# Patient Record
Sex: Female | Born: 1955 | Race: White | Hispanic: No | Marital: Married | Smoking: Current every day smoker
Health system: Southern US, Community
[De-identification: ages and names within clinical notes are randomized; demographics above are authoritative.]

## PROBLEM LIST (undated history)

## (undated) DIAGNOSIS — N950 Postmenopausal bleeding: Secondary | ICD-10-CM

## (undated) DIAGNOSIS — M1990 Unspecified osteoarthritis, unspecified site: Secondary | ICD-10-CM

## (undated) DIAGNOSIS — Z973 Presence of spectacles and contact lenses: Secondary | ICD-10-CM

## (undated) DIAGNOSIS — F411 Generalized anxiety disorder: Secondary | ICD-10-CM

## (undated) DIAGNOSIS — R7303 Prediabetes: Secondary | ICD-10-CM

## (undated) DIAGNOSIS — Z9889 Other specified postprocedural states: Secondary | ICD-10-CM

## (undated) DIAGNOSIS — G2581 Restless legs syndrome: Secondary | ICD-10-CM

## (undated) DIAGNOSIS — E785 Hyperlipidemia, unspecified: Secondary | ICD-10-CM

## (undated) DIAGNOSIS — Z85828 Personal history of other malignant neoplasm of skin: Secondary | ICD-10-CM

## (undated) DIAGNOSIS — I10 Essential (primary) hypertension: Secondary | ICD-10-CM

## (undated) DIAGNOSIS — J189 Pneumonia, unspecified organism: Secondary | ICD-10-CM

## (undated) HISTORY — PX: KNEE ARTHROSCOPY: SHX127

## (undated) HISTORY — DX: Prediabetes: R73.03

## (undated) HISTORY — DX: Essential (primary) hypertension: I10

## (undated) HISTORY — DX: Hyperlipidemia, unspecified: E78.5

## (undated) HISTORY — DX: Pneumonia, unspecified organism: J18.9

---
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
## 2006-03-01 ENCOUNTER — Encounter: Payer: Self-pay | Admitting: Family Medicine

## 2006-03-01 ENCOUNTER — Ambulatory Visit: Payer: Self-pay | Admitting: Family Medicine

## 2006-03-01 ENCOUNTER — Other Ambulatory Visit: Admission: RE | Admit: 2006-03-01 | Discharge: 2006-03-01 | Payer: Self-pay | Admitting: Family Medicine

## 2006-03-01 LAB — CONVERTED CEMR LAB: Pap Smear: NORMAL

## 2006-06-09 LAB — HM COLONOSCOPY

## 2006-07-17 DIAGNOSIS — F411 Generalized anxiety disorder: Secondary | ICD-10-CM

## 2006-07-17 DIAGNOSIS — F17200 Nicotine dependence, unspecified, uncomplicated: Secondary | ICD-10-CM

## 2006-11-06 ENCOUNTER — Encounter: Payer: Self-pay | Admitting: Family Medicine

## 2007-05-07 ENCOUNTER — Ambulatory Visit: Payer: Self-pay | Admitting: Family Medicine

## 2007-05-07 ENCOUNTER — Other Ambulatory Visit: Admission: RE | Admit: 2007-05-07 | Discharge: 2007-05-07 | Payer: Self-pay | Admitting: Family Medicine

## 2007-05-07 ENCOUNTER — Encounter: Payer: Self-pay | Admitting: Family Medicine

## 2007-05-07 DIAGNOSIS — R635 Abnormal weight gain: Secondary | ICD-10-CM

## 2007-05-07 DIAGNOSIS — M79609 Pain in unspecified limb: Secondary | ICD-10-CM

## 2007-05-07 LAB — CONVERTED CEMR LAB: Pap Smear: NORMAL

## 2007-05-08 ENCOUNTER — Encounter: Payer: Self-pay | Admitting: Family Medicine

## 2007-05-08 LAB — CONVERTED CEMR LAB
ALT: 28 units/L (ref 0–35)
AST: 28 units/L (ref 0–37)
Albumin: 4.3 g/dL (ref 3.5–5.2)
Alkaline Phosphatase: 61 units/L (ref 39–117)
BUN: 14 mg/dL (ref 6–23)
CO2: 28 meq/L (ref 19–32)
Calcium: 9.2 mg/dL (ref 8.4–10.5)
Chloride: 103 meq/L (ref 96–112)
Cholesterol, target level: 200 mg/dL
Cholesterol: 202 mg/dL — ABNORMAL HIGH (ref 0–200)
Creatinine, Ser: 0.77 mg/dL (ref 0.40–1.20)
Glucose, Bld: 94 mg/dL (ref 70–99)
HDL goal, serum: 40 mg/dL
HDL: 67 mg/dL (ref >39)
LDL Cholesterol: 114 mg/dL — ABNORMAL HIGH (ref 0–99)
LDL Goal: 160 mg/dL
Potassium: 4.3 meq/L (ref 3.5–5.3)
Sodium: 141 meq/L (ref 135–145)
TSH: 0.832 microintl units/mL (ref 0.350–5.50)
Total Bilirubin: 0.5 mg/dL (ref 0.3–1.2)
Total CHOL/HDL Ratio: 3
Total Protein: 7.2 g/dL (ref 6.0–8.3)
Triglycerides: 103 mg/dL (ref <150)
VLDL: 21 mg/dL (ref 0–40)

## 2007-05-14 ENCOUNTER — Ambulatory Visit: Payer: Self-pay | Admitting: Family Medicine

## 2007-05-27 ENCOUNTER — Encounter: Payer: Self-pay | Admitting: Family Medicine

## 2007-08-22 ENCOUNTER — Ambulatory Visit: Payer: Self-pay | Admitting: Family Medicine

## 2007-08-22 DIAGNOSIS — I10 Essential (primary) hypertension: Secondary | ICD-10-CM | POA: Insufficient documentation

## 2007-09-12 ENCOUNTER — Ambulatory Visit: Payer: Self-pay | Admitting: Family Medicine

## 2007-09-24 ENCOUNTER — Ambulatory Visit: Payer: Self-pay | Admitting: Family Medicine

## 2008-01-15 ENCOUNTER — Ambulatory Visit: Payer: Self-pay | Admitting: Family Medicine

## 2008-01-15 DIAGNOSIS — R0789 Other chest pain: Secondary | ICD-10-CM | POA: Insufficient documentation

## 2008-01-16 ENCOUNTER — Encounter: Payer: Self-pay | Admitting: Family Medicine

## 2008-01-16 LAB — CONVERTED CEMR LAB
ALT: 21 units/L (ref 0–35)
AST: 20 units/L (ref 0–37)
Albumin: 4.7 g/dL (ref 3.5–5.2)
Alkaline Phosphatase: 69 units/L (ref 39–117)
BUN: 12 mg/dL (ref 6–23)
CO2: 26 meq/L (ref 19–32)
Calcium: 9.1 mg/dL (ref 8.4–10.5)
Chloride: 102 meq/L (ref 96–112)
Creatinine, Ser: 0.77 mg/dL (ref 0.40–1.20)
Glucose, Bld: 86 mg/dL (ref 70–99)
HCT: 45.9 % (ref 36.0–46.0)
Hemoglobin: 15.3 g/dL — ABNORMAL HIGH (ref 12.0–15.0)
MCHC: 33.3 g/dL (ref 30.0–36.0)
MCV: 99.1 fL (ref 78.0–100.0)
Platelets: 268 10*3/uL (ref 150–400)
Potassium: 4.2 meq/L (ref 3.5–5.3)
RBC: 4.63 M/uL (ref 3.87–5.11)
RDW: 13 % (ref 11.5–15.5)
Sodium: 139 meq/L (ref 135–145)
TSH: 0.673 microintl units/mL (ref 0.350–5.50)
Total Bilirubin: 0.3 mg/dL (ref 0.3–1.2)
Total CK: 49 units/L (ref 7–177)
Total Protein: 7.9 g/dL (ref 6.0–8.3)
WBC: 6.7 10*3/uL (ref 4.0–10.5)

## 2008-02-05 ENCOUNTER — Encounter: Payer: Self-pay | Admitting: Family Medicine

## 2008-07-07 ENCOUNTER — Encounter: Payer: Self-pay | Admitting: Family Medicine

## 2008-07-14 ENCOUNTER — Ambulatory Visit: Payer: Self-pay | Admitting: Family Medicine

## 2008-07-14 ENCOUNTER — Encounter: Payer: Self-pay | Admitting: Family Medicine

## 2008-07-14 ENCOUNTER — Other Ambulatory Visit: Admission: RE | Admit: 2008-07-14 | Discharge: 2008-07-14 | Payer: Self-pay | Admitting: Family Medicine

## 2008-07-14 ENCOUNTER — Encounter: Admission: RE | Admit: 2008-07-14 | Discharge: 2008-07-14 | Payer: Self-pay | Admitting: Family Medicine

## 2008-07-14 DIAGNOSIS — R21 Rash and other nonspecific skin eruption: Secondary | ICD-10-CM

## 2008-07-14 DIAGNOSIS — R5383 Other fatigue: Secondary | ICD-10-CM

## 2008-07-14 DIAGNOSIS — Z780 Asymptomatic menopausal state: Secondary | ICD-10-CM | POA: Insufficient documentation

## 2008-07-14 DIAGNOSIS — R5381 Other malaise: Secondary | ICD-10-CM | POA: Insufficient documentation

## 2008-07-15 ENCOUNTER — Encounter: Payer: Self-pay | Admitting: Family Medicine

## 2008-07-16 DIAGNOSIS — E8881 Metabolic syndrome: Secondary | ICD-10-CM

## 2008-07-16 LAB — CONVERTED CEMR LAB
ALT: 22 units/L (ref 0–35)
AST: 24 units/L (ref 0–37)
Albumin: 4.4 g/dL (ref 3.5–5.2)
Alkaline Phosphatase: 62 units/L (ref 39–117)
BUN: 15 mg/dL (ref 6–23)
CO2: 24 meq/L (ref 19–32)
Calcium: 9.2 mg/dL (ref 8.4–10.5)
Chloride: 103 meq/L (ref 96–112)
Cholesterol: 197 mg/dL (ref 0–200)
Creatinine, Ser: 0.72 mg/dL (ref 0.40–1.20)
Folate: >20 ng/mL
Glucose, Bld: 106 mg/dL — ABNORMAL HIGH (ref 70–99)
HCT: 41 % (ref 36.0–46.0)
HDL: 51 mg/dL (ref >39)
Hemoglobin: 13.4 g/dL (ref 12.0–15.0)
LDL Cholesterol: 120 mg/dL — ABNORMAL HIGH (ref 0–99)
MCHC: 32.7 g/dL (ref 30.0–36.0)
MCV: 90.5 fL (ref 78.0–100.0)
Platelets: 367 10*3/uL (ref 150–400)
Potassium: 4.4 meq/L (ref 3.5–5.3)
RBC: 4.53 M/uL (ref 3.87–5.11)
RDW: 13.5 % (ref 11.5–15.5)
Sed Rate: 5 mm/hr (ref 0–22)
Sodium: 139 meq/L (ref 135–145)
TSH: 0.637 microintl units/mL (ref 0.350–4.50)
Testosterone: 42.83 ng/dL (ref 10–70)
Total Bilirubin: 0.3 mg/dL (ref 0.3–1.2)
Total CHOL/HDL Ratio: 3.9
Total Protein: 7.2 g/dL (ref 6.0–8.3)
Triglycerides: 130 mg/dL (ref <150)
VLDL: 26 mg/dL (ref 0–40)
Vit D, 1,25-Dihydroxy: 35 (ref 30–89)
Vitamin B-12: 934 pg/mL — ABNORMAL HIGH (ref 211–911)
WBC: 6.5 10*3/uL (ref 4.0–10.5)

## 2008-07-20 ENCOUNTER — Encounter: Payer: Self-pay | Admitting: Family Medicine

## 2008-07-21 ENCOUNTER — Encounter: Payer: Self-pay | Admitting: Family Medicine

## 2008-07-27 ENCOUNTER — Telehealth: Payer: Self-pay | Admitting: Family Medicine

## 2008-07-30 ENCOUNTER — Encounter: Payer: Self-pay | Admitting: Family Medicine

## 2008-08-04 ENCOUNTER — Telehealth: Payer: Self-pay | Admitting: Family Medicine

## 2008-08-14 ENCOUNTER — Ambulatory Visit: Payer: Self-pay | Admitting: Family Medicine

## 2008-08-14 DIAGNOSIS — M542 Cervicalgia: Secondary | ICD-10-CM | POA: Insufficient documentation

## 2008-08-14 DIAGNOSIS — G2581 Restless legs syndrome: Secondary | ICD-10-CM

## 2008-10-14 ENCOUNTER — Encounter: Payer: Self-pay | Admitting: Family Medicine

## 2008-11-20 ENCOUNTER — Ambulatory Visit: Payer: Self-pay | Admitting: Family Medicine

## 2008-11-27 ENCOUNTER — Ambulatory Visit: Payer: Self-pay | Admitting: Family Medicine

## 2008-11-27 LAB — CONVERTED CEMR LAB: Blood Glucose, Fasting: 116 mg/dL

## 2008-12-18 ENCOUNTER — Telehealth (INDEPENDENT_AMBULATORY_CARE_PROVIDER_SITE_OTHER): Payer: Self-pay | Admitting: *Deleted

## 2009-01-25 ENCOUNTER — Telehealth: Payer: Self-pay | Admitting: Family Medicine

## 2009-06-04 ENCOUNTER — Ambulatory Visit: Payer: Self-pay | Admitting: Family Medicine

## 2009-06-04 DIAGNOSIS — G4700 Insomnia, unspecified: Secondary | ICD-10-CM

## 2009-06-04 LAB — CONVERTED CEMR LAB: Blood Glucose, Fasting: 99 mg/dL

## 2009-06-22 ENCOUNTER — Ambulatory Visit: Payer: Self-pay | Admitting: Family Medicine

## 2009-10-13 ENCOUNTER — Encounter: Payer: Self-pay | Admitting: Family Medicine

## 2009-10-18 ENCOUNTER — Ambulatory Visit: Payer: Self-pay | Admitting: Family Medicine

## 2009-10-19 LAB — CONVERTED CEMR LAB
ALT: 18 units/L (ref 0–35)
AST: 22 units/L (ref 0–37)
Albumin: 4.6 g/dL (ref 3.5–5.2)
Alkaline Phosphatase: 59 units/L (ref 39–117)
BUN: 10 mg/dL (ref 6–23)
CO2: 26 meq/L (ref 19–32)
Calcium: 9.2 mg/dL (ref 8.4–10.5)
Chloride: 101 meq/L (ref 96–112)
Cholesterol: 248 mg/dL — ABNORMAL HIGH (ref 0–200)
Creatinine, Ser: 0.83 mg/dL (ref 0.40–1.20)
Glucose, Bld: 102 mg/dL — ABNORMAL HIGH (ref 70–99)
HDL: 73 mg/dL (ref >39)
LDL Cholesterol: 151 mg/dL — ABNORMAL HIGH (ref 0–99)
Potassium: 4.3 meq/L (ref 3.5–5.3)
Sodium: 140 meq/L (ref 135–145)
Total Bilirubin: 0.4 mg/dL (ref 0.3–1.2)
Total CHOL/HDL Ratio: 3.4
Total Protein: 7.4 g/dL (ref 6.0–8.3)
Triglycerides: 119 mg/dL (ref <150)
VLDL: 24 mg/dL (ref 0–40)

## 2009-11-12 ENCOUNTER — Ambulatory Visit: Payer: Self-pay | Admitting: Family Medicine

## 2009-11-18 ENCOUNTER — Telehealth: Payer: Self-pay | Admitting: Family Medicine

## 2009-11-22 ENCOUNTER — Encounter: Payer: Self-pay | Admitting: Family Medicine

## 2009-12-10 ENCOUNTER — Ambulatory Visit: Payer: Self-pay | Admitting: Family Medicine

## 2010-03-30 ENCOUNTER — Ambulatory Visit: Payer: Self-pay | Admitting: Family Medicine

## 2010-03-30 DIAGNOSIS — E785 Hyperlipidemia, unspecified: Secondary | ICD-10-CM

## 2010-03-31 LAB — CONVERTED CEMR LAB: AST: 19 units/L (ref 0–37)

## 2010-10-20 ENCOUNTER — Other Ambulatory Visit: Payer: Self-pay | Admitting: Family Medicine

## 2010-10-20 ENCOUNTER — Other Ambulatory Visit
Admission: RE | Admit: 2010-10-20 | Discharge: 2010-10-20 | Payer: Self-pay | Source: Home / Self Care | Admitting: Family Medicine

## 2010-10-20 ENCOUNTER — Ambulatory Visit
Admission: RE | Admit: 2010-10-20 | Discharge: 2010-10-20 | Payer: Self-pay | Source: Home / Self Care | Attending: Family Medicine | Admitting: Family Medicine

## 2010-10-24 ENCOUNTER — Encounter: Payer: Self-pay | Admitting: Family Medicine

## 2010-10-25 LAB — CONVERTED CEMR LAB
Albumin: 4.6 g/dL (ref 3.5–5.2)
BUN: 14 mg/dL (ref 6–23)
CO2: 30 meq/L (ref 19–32)
Calcium: 9.2 mg/dL (ref 8.4–10.5)
Chloride: 100 meq/L (ref 96–112)
Glucose, Bld: 100 mg/dL — ABNORMAL HIGH (ref 70–99)
HDL: 63 mg/dL (ref >39)
Hemoglobin: 14.4 g/dL (ref 12.0–15.0)
LDL Cholesterol: 99 mg/dL (ref 0–99)
MCHC: 32.4 g/dL (ref 30.0–36.0)
Potassium: 4.4 meq/L (ref 3.5–5.3)
RBC: 4.57 M/uL (ref 3.87–5.11)
Sodium: 139 meq/L (ref 135–145)
Total Protein: 7.2 g/dL (ref 6.0–8.3)
Triglycerides: 107 mg/dL (ref <150)
WBC: 5.8 10*3/uL (ref 4.0–10.5)

## 2010-10-27 LAB — CYTOLOGY - PAP: Pap Smear: NORMAL

## 2010-11-08 NOTE — Miscellaneous (Signed)
  Clinical Lists Changes  Medications: Added new medication of REQUIP 1 MG TABS (ROPINIROLE HCL) Take one tablet by mouth daily - Signed Rx of REQUIP 1 MG TABS (ROPINIROLE HCL) Take one tablet by mouth daily;  #30 x 0;  Signed;  Entered by: Kathlene November;  Authorized by: Nani Gasser MD;  Method used: [facility] to [facility] [phone]*, [address] [place], [place]  [address], Ph: [phone] or [phone], Fax: [phone]    Prescriptions: REQUIP 1 MG TABS (ROPINIROLE HCL) Take one tablet by mouth daily  #30 x 0   Entered by:   Kathlene November   Authorized by:   Nani Gasser MD   Signed by:   Kathlene November on 11/22/2009   Method used:   Electronically to        [facility] [phone]* (retail)       [address] [place], [place]  [address]       Ph: [phone] or [phone]       Fax: [phone]   RxID:   [id number]
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
## 2010-11-08 NOTE — Assessment & Plan Note (Signed)
Summary: CPE   Vital Signs:  Patient profile:   54 year old female Height:      64.5 inches Weight:      143 pounds Pulse rate:   70 / minute BP sitting:   132 / 79  (left arm) Cuff size:   regular  Vitals Entered By: Kathlene November (October 18, 2009 8:23 AM) CC: CPE no pap   Primary Care Provider:  Linford Arnold, C.   CC:  CPE no pap.  History of Present Illness: here for CPE. No specific complaints but still having insomnia. Says the Ambien CR didnt really help. Since quitting her old job she does feel less stressed so says still waking up frequently but doesn't take as long to fall back asleep. Notices her joint pain flares worse when her mood is down.  She saw rheumatology and they felt she didn't really meet enought criteia for fibromyalgia. They did feel some of it is stress induced.   Current Medications (verified): 1)  Alprazolam 0.5 Mg Tabs (Alprazolam) .... Take 1 Tablet By Mouth Two Times A Day As Needed 2)  Lexapro 20 Mg Tabs (Escitalopram Oxalate) .... Take 1 Tablet By Mouth Once A Day  Allergies (verified): 1)  ! Celexa (Citalopram Hydrobromide)  Comments:  Nurse/Medical Assistant: The patient's medications and allergies were reviewed with the patient and were updated in the Medication and Allergy Lists. Kathlene November (October 18, 2009 8:24 AM)  Past History:  Past Surgical History: Last updated: 11/06/2006 arthroscopy  L knee  1980, 1981  T & A  at age 38  Family History: Last updated: 07/14/2008 Mother-MI at age 24, stroke Mother at age 15 -BrCa, COPD (smoker).   Social History: Working at [employer].  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  Married to [place] with 2 children.  Smokes 1 ppd for 30+yrs, 1 EtOH/day, no drugs, 1 caff/day, walks regularly.    Impression & Recommendations:  Problem # 1:  HEALTH SCREENING (ICD-V70.0)  See pt H.O. Due fore screning labs.  Vaccines are up to date.  pap is up to date. Due next week.   Orders: T-Comprehensive Metabolic Panel [id number]) T-Lipid Profile ([id number])  Problem # 2:  ANXIETY (ICD-300.00) Discussed chaning to cymbalta as she really feels her joint pain is related to her mood and rheumatology agrees.  Fu in 3 weeks. samples given. Call if any problems. See pt H.O.  on instructions.   Her updated medication list for this problem includes:    Alprazolam 0.5 Mg Tabs (Alprazolam) .Marland Kitchen... Take 1 tablet by mouth two times a day as needed    Lexapro 20 Mg Tabs (Escitalopram oxalate) .Marland Kitchen... Take 1 tablet by mouth once a day  Complete Medication List: 1)  Alprazolam 0.5 Mg Tabs (Alprazolam) .... Take 1 tablet by mouth two times a day as needed 2)  Lexapro 20 Mg Tabs (Escitalopram oxalate) .... Take 1 tablet by mouth once a day  Patient Instructions: 1)  Decrease Lexapro to 10mg  for one week then stop and start the cymbalta 30mg  daily. May feel a little more anxious during the switch 2)  Then followup in 3 weeks to see how doing on the cymbalta.   3)  Encourage more regular exercise. 4)  Continue your calcium and vitamin D.   Flu Vaccine Next Due:  Refused Flex Sig Next Due:  Not Indicated Hemoccult Next Due:  Not Indicated

## 2010-11-08 NOTE — Assessment & Plan Note (Signed)
Summary: Anxiety , Insomnia   Vital Signs:  Patient profile:   55 year old female Weight:      144.50 pounds Pulse rate:   83 / minute BP sitting:   122 / 77  Vitals Entered By: Kandice Hams (November 12, 2009 3:02 PM) CC: FOLLOWUP ON MED   Primary Care Provider:  Linford Arnold, C.   CC:  FOLLOWUP ON MED.  History of Present Illness: Feels the cymbalta is helpign adn helping her pain in her lower back. Would like to increase to 60mg .  Hot flashes are a little more but not as bad as when first stopped the lexapro. No other S.E.  Has been happy with the med thus far.  Less anxious when wakes up.   Requip didn't help for sleep. Ambien didn't work Recruitment consultant. Lunesta didn't work. Xanax does seem to help.  Years of chronic insomnia.     Allergies: 1)  ! Celexa (Citalopram Hydrobromide)  Physical Exam  General:  Well-developed,well-nourished,in no acute distress; alert,appropriate and cooperative throughout examination Lungs:  Normal respiratory effort, chest expands symmetrically. Lungs are clear to auscultation, no crackles or wheezes. Heart:  Normal rate and regular rhythm. S1 and S2 normal without gallop, murmur, click, rub or other extra sounds. Skin:  no rashes.   Psych:  Cognition and judgment appear intact. Alert and cooperative with normal attention span and concentration. No apparent delusions, illusions, hallucinations   Impression & Recommendations:  Problem # 1:  ANXIETY (ICD-300.00) Increase the cymbalta for 60mg .  I hope this will improve her sxs. FU in about 6 months.  Call if any S.E.  F/u in 6 weeks.  Her updated medication list for this problem includes:    Alprazolam 0.5 Mg Tabs (Alprazolam) .Marland Kitchen... Take 1 tablet by mouth two times a day as needed    Cymbalta 60 Mg Cpep (Duloxetine hcl) .Marland Kitchen... Take 1 tablet by mouth once a day  Problem # 2:  INSOMNIA (ICD-780.52) Consider seroquel for sleep. Discussed that this is not FDA approved used but with her hx of resistance to  typical sleep aids it may be more mood related. Will increase the cymbalta for now and see if gets some improvement in her sleep with this.    Complete Medication List: 1)  Alprazolam 0.5 Mg Tabs (Alprazolam) .... Take 1 tablet by mouth two times a day as needed 2)  Cymbalta 60 Mg Cpep (Duloxetine hcl) .... Take 1 tablet by mouth once a day 3)  Simvastatin 20 Mg Tabs (Simvastatin) .... Take 1 tablet by mouth once a day at bedtime Prescriptions: CYMBALTA 60 MG CPEP (DULOXETINE HCL) Take 1 tablet by mouth once a day  #30 x 1   Entered and Authorized by:   Nani Gasser MD   Signed by:   Nani Gasser MD on 11/12/2009   Method used:   [facility] to        [facility] [phone][facility] (retail)       [address] [place], [place]  [address]       Ph: [phone] or [phone]       Fax: [phone]   RxID:   [id number]
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
## 2010-11-08 NOTE — Progress Notes (Signed)
Summary: new rx  Phone Note Call from Patient Call back at Home Phone [phone]   Caller: Patient Call For: Gerica Koble Summary of Call: came in a week ago and was put on cymbalta.  She wasn't sleeping and she needs something.  She said you put in a standing order? Initial call taken by: Alfred Levins, CMA,  November 18, 2009 3:11 PM  Follow-up for Phone Call        OK will call in seroquel as discussed at OV>  Follow-up by: Nani Gasser MD,  November 18, 2009 4:04 PM  Additional Follow-up for Phone Call Additional follow up Details #1::        pt aware Additional Follow-up by: Alfred Levins, CMA,  November 18, 2009 4:24 PM    New/Updated Medications: SEROQUEL 25 MG TABS (QUETIAPINE FUMARATE) Take 1 tablet by mouth once a day at bedtime Prescriptions: SEROQUEL 25 MG TABS (QUETIAPINE FUMARATE) Take 1 tablet by mouth once a day at bedtime  #30 x 0   Entered and Authorized by:   Nani Gasser MD   Signed by:   Nani Gasser MD on 11/18/2009   Method used:   [facility]ically to        [facility] [phone]* (retail)       [address] [place], [place]  [address]       Ph: [phone] or [phone]       Fax: [phone]   RxID:   [id number]

## 2010-11-10 NOTE — Assessment & Plan Note (Signed)
Summary: Pap only, lab slip   Vital Signs:  Patient profile:   55 year old female Height:      64.5 inches Weight:      147 pounds Pulse rate:   90 / minute BP sitting:   142 / 74  (right arm) Cuff size:   regular  Vitals Entered By: Avon Gully CMA, Duncan Dull) (October 20, 2010 10:34 AM) CC: pap only   Primary Care Provider:  Nani Gasser MD  CC:  pap only.  History of Present Illness: No concerns.Still some pain with vaginal intercourse.Sthinks she is due for labs as well.   Current Medications (verified): 1)  Alprazolam 0.5 Mg Tabs (Alprazolam) .... Take 1 Tablet By Mouth Two Times A Day As Needed 2)  Simvastatin 20 Mg Tabs (Simvastatin) .... Take 1 Tablet By Mouth Once A Day At Bedtime 3)  Lexapro 20 Mg Tabs (Escitalopram Oxalate) .Marland Kitchen.. 1 Tab By Mouth Daily  Allergies (verified): 1)  ! Celexa (Citalopram Hydrobromide)  Comments:  Nurse/Medical Assistant: The patient's medications and allergies were reviewed with the patient and were updated in the Medication and Allergy Lists. Avon Gully CMA, Duncan Dull) (October 20, 2010 10:35 AM)  Physical Exam  General:  Well-developed,well-nourished,in no acute distress; alert,appropriate and cooperative throughout examination Genitalia:  Normal introitus for age, no external lesions, no vaginal discharge, mucosa pink and moist, no vaginal or cervical lesions, no vaginal atrophy, no friaility or hemorrhage, normal uterus size and position, no adnexal masses or tenderness   Impression & Recommendations:  Problem # 1:  SCREENING FOR MALIGNANT NEOPLASM OF THE CERVIX (ICD-V76.2) Exam is normal. We will call with the results.   Problem # 2:  HYPERLIPIDEMIA (ICD-272.4) Due to recheck labs. She says she is doing well on the simvastatin and taking it much more consistantly.  Her updated medication list for this problem includes:    Simvastatin 20 Mg Tabs (Simvastatin) .Marland Kitchen... Take 1 tablet by mouth once a day at  bedtime  Complete Medication List: 1)  Alprazolam 0.5 Mg Tabs (Alprazolam) .... Take 1 tablet by mouth two times a day as needed 2)  Simvastatin 20 Mg Tabs (Simvastatin) .... Take 1 tablet by mouth once a day at bedtime 3)  Lexapro 20 Mg Tabs (Escitalopram oxalate) .Marland Kitchen.. 1 tab by mouth daily  Other Orders: T-Comprehensive Metabolic Panel [id number]) T-Lipid Profile [id number]) T-CBC No Diff ([id number])   Orders Added: 1)  T-Comprehensive Metabolic Panel [[id number]] 2)  T-Lipid Profile [[id number]] 3)  T-CBC No Diff [[id number]] 4)  Est. Patient Level III [96295]  Appended Document: Pap only, lab slip Call pt: Normal Mammogram. Return for follow up Mammogram in 1 year January 13, 20121:15 PM [place] MD, [facility]   4:46 PM October 21, 2010 McCrimmon CMA, Duncan Dull), Sue Lush pt notified

## 2011-02-07 ENCOUNTER — Encounter: Payer: Self-pay | Admitting: Family Medicine

## 2011-02-08 ENCOUNTER — Encounter: Payer: Self-pay | Admitting: Family Medicine

## 2011-02-08 ENCOUNTER — Ambulatory Visit (INDEPENDENT_AMBULATORY_CARE_PROVIDER_SITE_OTHER): Payer: BC Managed Care – PPO | Admitting: Family Medicine

## 2011-02-08 DIAGNOSIS — K529 Noninfective gastroenteritis and colitis, unspecified: Secondary | ICD-10-CM

## 2011-02-08 DIAGNOSIS — K5289 Other specified noninfective gastroenteritis and colitis: Secondary | ICD-10-CM

## 2011-02-08 DIAGNOSIS — R030 Elevated blood-pressure reading, without diagnosis of hypertension: Secondary | ICD-10-CM

## 2011-02-08 NOTE — Assessment & Plan Note (Signed)
Recheck in 1-2 weeks. If still elevated then will need to start a BP medication.  Pt agrees to return after her trip.  Her BP was elevated in Jan as well. Discussed diet, exercise and dec salt intake.

## 2011-02-08 NOTE — Patient Instructions (Signed)
Follow up in 1-2 weeks to recheck you BP.  Avoid Advil and decongestants before you come.

## 2011-02-08 NOTE — Progress Notes (Signed)
  Subjective:    Patient ID: Valerie Carroll, female    DOB: [birth year], 55 y.o.   MRN: [id number]  HPI  Micah Flesher out to eat [place] in [place]. That night started N/V/D.  Husband didn't feel well but no vomiting. Started finally feeling better last night.  Missed Mon, Tues, Wed at work. No blood in stool. She is feeling better. No fever.  No more diarrhea today.   Review of Systems     Objective:   Physical Exam  Constitutional: She appears well-developed and well-nourished.  HENT:  Head: Normocephalic and atraumatic.  Cardiovascular: Normal rate, regular rhythm and normal heart sounds.   Pulmonary/Chest: Effort normal and breath sounds normal.  Abdominal: Soft. Bowel sounds are normal. She exhibits distension. She exhibits no mass. There is no tenderness. There is no rebound and no guarding.       Increased tympany.           Assessment & Plan:  Gastroenteritis - Likley from food poisoning. She is feeling better and has not had any blood in her stool. This is reassuring. Call if not continuing to improve.    BP is high today. She is really stressed.  Going to a wedding this weekend and having to drive to [place] for this.  Recheck in 1-2 weeks. If still elevated will need ot start a BP medication.  Pt agrees to return. Dsicussed low sat diet.

## 2011-03-31 ENCOUNTER — Other Ambulatory Visit: Payer: Self-pay | Admitting: Family Medicine

## 2011-03-31 ENCOUNTER — Ambulatory Visit (INDEPENDENT_AMBULATORY_CARE_PROVIDER_SITE_OTHER): Payer: BC Managed Care – PPO | Admitting: Family Medicine

## 2011-03-31 VITALS — BP 155/66 | HR 74 | Resp 20 | Ht 65.0 in | Wt 152.0 lb

## 2011-03-31 DIAGNOSIS — I10 Essential (primary) hypertension: Secondary | ICD-10-CM

## 2011-03-31 MED ORDER — SIMVASTATIN 20 MG PO TABS: 20.0000 mg | ORAL_TABLET | Freq: Every day | ORAL | Status: DC

## 2011-03-31 MED ORDER — HYDROCHLOROTHIAZIDE 25 MG PO TABS: 25.0000 mg | ORAL_TABLET | Freq: Every day | ORAL | Status: DC

## 2011-03-31 MED ORDER — ALPRAZOLAM 0.5 MG PO TABS: 0.5000 mg | ORAL_TABLET | Freq: Two times a day (BID) | ORAL | Status: DC | PRN

## 2011-03-31 MED ORDER — ESCITALOPRAM OXALATE 20 MG PO TABS: 20.0000 mg | ORAL_TABLET | Freq: Every day | ORAL | Status: DC

## 2011-03-31 NOTE — Assessment & Plan Note (Signed)
New dx. Will go for labs. Given lab slip. F/u in 3-4 weeks. Will check TSH.

## 2011-03-31 NOTE — Progress Notes (Signed)
  Subjective:    Patient ID: Valerie Carroll, female    DOB: 1956-03-29, 55 y.o.   MRN: [id number]  HPI Here for BP check. Has been having HA so started checking her BP a few weeks ago and has been persistantly elevated. No prior hx of HTN.  She does need refill on all other meds.    Review of Systems     Objective:   Physical Exam        Assessment & Plan:

## 2011-04-01 LAB — COMPLETE METABOLIC PANEL WITH GFR
ALT: 16 U/L (ref 0–35)
Albumin: 4.3 g/dL (ref 3.5–5.2)
Alkaline Phosphatase: 55 U/L (ref 39–117)
CO2: 28 mEq/L (ref 19–32)
GFR, Est African American: >60 mL/min (ref >60)
GFR, Est Non African American: >60 mL/min (ref >60)
Glucose, Bld: 107 mg/dL — ABNORMAL HIGH (ref 70–99)
Potassium: 3.5 mEq/L (ref 3.5–5.3)
Sodium: 138 mEq/L (ref 135–145)
Total Bilirubin: 0.4 mg/dL (ref 0.3–1.2)
Total Protein: 7.1 g/dL (ref 6.0–8.3)

## 2011-04-02 ENCOUNTER — Telehealth: Payer: Self-pay | Admitting: Family Medicine

## 2011-04-02 NOTE — Telephone Encounter (Signed)
Call pt: Thyroid is nl. Liver is back to nl.  Blood sugar is still in the pre-diabeitc range.

## 2011-04-03 NOTE — Telephone Encounter (Signed)
LMOM advising pt of results

## 2011-04-03 NOTE — Telephone Encounter (Signed)
Closed

## 2011-05-03 ENCOUNTER — Ambulatory Visit (INDEPENDENT_AMBULATORY_CARE_PROVIDER_SITE_OTHER): Payer: BC Managed Care – PPO | Admitting: Family Medicine

## 2011-05-03 ENCOUNTER — Encounter: Payer: Self-pay | Admitting: Family Medicine

## 2011-05-03 DIAGNOSIS — I10 Essential (primary) hypertension: Secondary | ICD-10-CM

## 2011-05-03 MED ORDER — LISINOPRIL-HYDROCHLOROTHIAZIDE 20-12.5 MG PO TABS: 1.0000 | ORAL_TABLET | Freq: Every day | ORAL | Status: DC

## 2011-05-03 NOTE — Assessment & Plan Note (Signed)
Discussed adding ACE for better control. Then f/u in 1 mo. Will check BMP at that time.

## 2011-05-03 NOTE — Progress Notes (Signed)
  Subjective:    Patient ID: Valerie Carroll, female    DOB: 10-05-1956, 55 y.o.   MRN: [id number]  Hypertension This is a chronic problem. The current episode started more than 1 year ago. The problem is controlled. Pertinent negatives include no chest pain, headaches or shortness of breath. There are no associated agents to hypertension. Past treatments include diuretics. The current treatment provides significant improvement. There are no compliance problems.       Review of Systems  Respiratory: Negative for shortness of breath.   Cardiovascular: Negative for chest pain.  Neurological: Negative for headaches.       Objective:   Physical Exam  Constitutional: She appears well-developed and well-nourished.  HENT:  Head: Normocephalic and atraumatic.  Cardiovascular: Normal rate, regular rhythm and normal heart sounds.   Pulmonary/Chest: Effort normal and breath sounds normal.  Skin: Skin is warm and dry.  Psychiatric: She has a normal mood and affect. Her behavior is normal.          Assessment & Plan:

## 2011-07-05 ENCOUNTER — Ambulatory Visit (INDEPENDENT_AMBULATORY_CARE_PROVIDER_SITE_OTHER): Payer: BC Managed Care – PPO | Admitting: Family Medicine

## 2011-07-05 ENCOUNTER — Encounter: Payer: Self-pay | Admitting: Family Medicine

## 2011-07-05 VITALS — BP 120/70 | HR 88 | Ht 65.0 in | Wt 151.0 lb

## 2011-07-05 DIAGNOSIS — I10 Essential (primary) hypertension: Secondary | ICD-10-CM

## 2011-07-05 DIAGNOSIS — T887XXA Unspecified adverse effect of drug or medicament, initial encounter: Secondary | ICD-10-CM

## 2011-07-05 MED ORDER — LOSARTAN POTASSIUM-HCTZ 50-12.5 MG PO TABS: 1.0000 | ORAL_TABLET | Freq: Every day | ORAL | Status: DC

## 2011-07-05 NOTE — Progress Notes (Signed)
  Subjective:    Patient ID: Valerie Carroll, female    DOB: 09-09-1956, 55 y.o.   MRN: [id number]  HPI  HTN - cough started about 3 weeks after started the medication.  Will having coughing fits at least twice a day.  O/W tolerating it well. No SE. No dizzines. No CP or SOB.   Review of Systems     Objective:   Physical Exam  Constitutional: She is oriented to person, place, and time. She appears well-developed and well-nourished.  Cardiovascular: Normal rate, regular rhythm and normal heart sounds.   Pulmonary/Chest: Effort normal and breath sounds normal.  Musculoskeletal: She exhibits no edema.  Neurological: She is alert and oriented to person, place, and time.  Skin: Skin is warm and dry.  Psychiatric: She has a normal mood and affect. Her behavior is normal.          Assessment & Plan:  HTN - doing well. Will change to ARB bc of cough. F.U in 6 months.  Will be due for labs at this time

## 2011-08-01 ENCOUNTER — Ambulatory Visit (INDEPENDENT_AMBULATORY_CARE_PROVIDER_SITE_OTHER): Payer: BC Managed Care – PPO | Admitting: Family Medicine

## 2011-08-01 ENCOUNTER — Encounter: Payer: Self-pay | Admitting: Family Medicine

## 2011-08-01 VITALS — BP 125/78 | HR 76 | Wt 153.0 lb

## 2011-08-01 DIAGNOSIS — I10 Essential (primary) hypertension: Secondary | ICD-10-CM

## 2011-08-01 DIAGNOSIS — R05 Cough: Secondary | ICD-10-CM

## 2011-08-01 NOTE — Progress Notes (Signed)
  Subjective:    Patient ID: Valerie Carroll, female    DOB: [birth year], 55 y.o.   MRN: [id number]  HPI Still coughing, but worse at night. No GERD sxs.  Does eat a lot of greasy and spicdy foods. She's been taking her medication consistently. She is not having any chest pain or shortness of breath. Just a cough which is primarily dry. She does have coughing spasms at night and does occasional wake up in the morning with a headache because of this. She has never really had problems with reflux before. No fever. We have not performed chest x-ray yet.   Review of Systems     Objective:   Physical Exam  Constitutional: She is oriented to person, place, and time. She appears well-developed and well-nourished.  HENT:  Head: Normocephalic and atraumatic.  Neck: Neck supple. No thyromegaly present.  Cardiovascular: Normal rate, regular rhythm and normal heart sounds.   Pulmonary/Chest: Effort normal and breath sounds normal.  Musculoskeletal: She exhibits no edema.  Lymphadenopathy:    She has no cervical adenopathy.  Neurological: She is alert and oriented to person, place, and time.  Skin: Skin is warm and dry.  Psychiatric: She has a normal mood and affect. Her behavior is normal.          Assessment & Plan:  Cough_ I still really don't think this is the ARB, I would like her to try the Dexilant samples for one week to treat potential reflux. If not better then will change to metoprolol and HCT. If still coughing after that will need CXR. If is better on the dexilant will need to make some major dietary changes. Reviewed avoiding greasy and spicy foods. Avoiding carbonated beverages and caffeine. Avoiding eating before bedtime.  Htn - WEll conrolled. F/U in 6 months.

## 2011-08-01 NOTE — Patient Instructions (Signed)
Call me in one week and let me know how you are feeling.

## 2011-08-23 ENCOUNTER — Other Ambulatory Visit: Payer: Self-pay | Admitting: *Deleted

## 2011-08-23 MED ORDER — LOSARTAN POTASSIUM-HCTZ 50-12.5 MG PO TABS: 1.0000 | ORAL_TABLET | Freq: Every day | ORAL | Status: DC

## 2011-08-23 MED ORDER — DEXLANSOPRAZOLE 60 MG PO CPDR: 60.0000 mg | DELAYED_RELEASE_CAPSULE | Freq: Every day | ORAL | Status: AC

## 2011-08-25 ENCOUNTER — Other Ambulatory Visit: Payer: Self-pay | Admitting: *Deleted

## 2011-08-25 MED ORDER — OMEPRAZOLE 40 MG PO CPDR: 40.0000 mg | DELAYED_RELEASE_CAPSULE | Freq: Every day | ORAL | Status: DC

## 2011-11-20 ENCOUNTER — Other Ambulatory Visit (HOSPITAL_COMMUNITY)
Admission: RE | Admit: 2011-11-20 | Discharge: 2011-11-20 | Disposition: A | Discharge status: Home / Self Care | Payer: BC Managed Care – PPO | Source: Ambulatory Visit | Attending: Family Medicine | Admitting: Family Medicine

## 2011-11-20 ENCOUNTER — Ambulatory Visit (INDEPENDENT_AMBULATORY_CARE_PROVIDER_SITE_OTHER): Payer: BC Managed Care – PPO | Admitting: Family Medicine

## 2011-11-20 ENCOUNTER — Encounter: Payer: Self-pay | Admitting: Family Medicine

## 2011-11-20 VITALS — BP 117/63 | HR 88 | Wt 152.0 lb

## 2011-11-20 DIAGNOSIS — Z01419 Encounter for gynecological examination (general) (routine) without abnormal findings: Secondary | ICD-10-CM

## 2011-11-20 DIAGNOSIS — Z124 Encounter for screening for malignant neoplasm of cervix: Secondary | ICD-10-CM

## 2011-11-20 DIAGNOSIS — K219 Gastro-esophageal reflux disease without esophagitis: Secondary | ICD-10-CM

## 2011-11-20 MED ORDER — OMEPRAZOLE 40 MG PO CPDR: 40.0000 mg | DELAYED_RELEASE_CAPSULE | Freq: Every day | ORAL | Status: DC

## 2011-11-20 NOTE — Progress Notes (Signed)
  Subjective:    Patient ID: Valerie Carroll, female    DOB: [birth year], 56 y.o.   MRN: [id number]  HPI  here for Pap smear only today. She has no specific complaints. Her last Pap smear was normal.  GERD-she says her reflux medication is working very well. Infection like a new prescription sent [facility]. She says it has helped her cough significantly.   Review of Systems     Objective:   Physical Exam  Genitourinary: Vagina normal and uterus normal. There is no rash on the right labia. There is no rash on the left labia. Cervix exhibits no motion tenderness, no discharge and no friability. Right adnexum displays tenderness. Right adnexum displays no mass and no fullness. Left adnexum displays no mass, no tenderness and no fullness. No erythema, tenderness or bleeding around the vagina. No foreign body around the vagina. No signs of injury around the vagina. No vaginal discharge found.          Assessment & Plan:  Pap only. We will call with the results. She was a little tender in the right lower quadrant on exam today. Asked her to keep an eye on this area. She has not been having any pain or problems there previously.  GERD-I will send her for 90 supply to her mail order pharmacy.

## 2011-11-28 ENCOUNTER — Encounter: Payer: Self-pay | Admitting: *Deleted

## 2012-01-09 ENCOUNTER — Other Ambulatory Visit: Payer: Self-pay | Admitting: *Deleted

## 2012-01-09 MED ORDER — LOSARTAN POTASSIUM-HCTZ 50-12.5 MG PO TABS: 1.0000 | ORAL_TABLET | Freq: Every day | ORAL | Status: DC

## 2012-01-09 MED ORDER — OMEPRAZOLE 40 MG PO CPDR: 40.0000 mg | DELAYED_RELEASE_CAPSULE | Freq: Every day | ORAL | Status: DC

## 2012-01-09 MED ORDER — ALPRAZOLAM 0.5 MG PO TABS: 0.5000 mg | ORAL_TABLET | Freq: Two times a day (BID) | ORAL | Status: DC | PRN

## 2012-01-23 ENCOUNTER — Other Ambulatory Visit: Payer: Self-pay | Admitting: *Deleted

## 2012-01-23 MED ORDER — OMEPRAZOLE 40 MG PO CPDR: 40.0000 mg | DELAYED_RELEASE_CAPSULE | Freq: Every day | ORAL | Status: DC

## 2012-03-09 ENCOUNTER — Other Ambulatory Visit: Payer: Self-pay | Admitting: Family Medicine

## 2012-05-13 ENCOUNTER — Encounter: Payer: Self-pay | Admitting: Family Medicine

## 2012-05-13 ENCOUNTER — Ambulatory Visit (INDEPENDENT_AMBULATORY_CARE_PROVIDER_SITE_OTHER): Payer: BC Managed Care – PPO | Admitting: Family Medicine

## 2012-05-13 ENCOUNTER — Ambulatory Visit (INDEPENDENT_AMBULATORY_CARE_PROVIDER_SITE_OTHER): Payer: BC Managed Care – PPO

## 2012-05-13 VITALS — BP 116/70 | HR 72 | Ht 65.0 in | Wt 160.0 lb

## 2012-05-13 DIAGNOSIS — R232 Flushing: Secondary | ICD-10-CM

## 2012-05-13 DIAGNOSIS — E785 Hyperlipidemia, unspecified: Secondary | ICD-10-CM

## 2012-05-13 DIAGNOSIS — R209 Unspecified disturbances of skin sensation: Secondary | ICD-10-CM

## 2012-05-13 DIAGNOSIS — R200 Anesthesia of skin: Secondary | ICD-10-CM

## 2012-05-13 DIAGNOSIS — M5030 Other cervical disc degeneration, unspecified cervical region: Secondary | ICD-10-CM

## 2012-05-13 DIAGNOSIS — G5620 Lesion of ulnar nerve, unspecified upper limb: Secondary | ICD-10-CM

## 2012-05-13 DIAGNOSIS — N951 Menopausal and female climacteric states: Secondary | ICD-10-CM

## 2012-05-13 MED ORDER — MELOXICAM 15 MG PO TABS: 15.0000 mg | ORAL_TABLET | Freq: Every day | ORAL | Status: DC

## 2012-05-13 MED ORDER — PREDNISONE 20 MG PO TABS: 40.0000 mg | ORAL_TABLET | Freq: Every day | ORAL | Status: AC

## 2012-05-13 MED ORDER — PREDNISONE 20 MG PO TABS: 40.0000 mg | ORAL_TABLET | Freq: Every day | ORAL | Status: DC

## 2012-05-13 NOTE — Patient Instructions (Addendum)
Try the steroids and then can start the meloxicam Call if not better in 2 weeks.  We will call with the xray report.  We will call you with your lab results. If you don't here from [place] in about a week then please give [place] a call at [phone].
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
## 2012-05-14 LAB — LIPID PANEL
HDL: 64 mg/dL (ref >39)
LDL Cholesterol: 89 mg/dL (ref 0–99)
Triglycerides: 104 mg/dL (ref <150)
VLDL: 21 mg/dL (ref 0–40)

## 2012-05-15 LAB — COMPLETE METABOLIC PANEL WITH GFR
ALT: 16 U/L (ref 0–35)
AST: 16 U/L (ref 0–37)
CO2: 28 mEq/L (ref 19–32)
Creat: 0.62 mg/dL (ref 0.50–1.10)
Sodium: 140 mEq/L (ref 135–145)
Total Bilirubin: 0.2 mg/dL — ABNORMAL LOW (ref 0.3–1.2)
Total Protein: 6.8 g/dL (ref 6.0–8.3)

## 2012-05-15 LAB — FOLLICLE STIMULATING HORMONE: FSH: 66.1 m[IU]/mL

## 2012-05-15 LAB — LUTEINIZING HORMONE: LH: 38.5 m[IU]/mL

## 2012-05-15 LAB — PROGESTERONE: Progesterone: <0.2 ng/mL

## 2012-06-13 ENCOUNTER — Encounter: Payer: Self-pay | Admitting: Family Medicine

## 2012-06-13 ENCOUNTER — Ambulatory Visit (INDEPENDENT_AMBULATORY_CARE_PROVIDER_SITE_OTHER): Payer: BC Managed Care – PPO | Admitting: Family Medicine

## 2012-06-13 ENCOUNTER — Telehealth: Payer: Self-pay | Admitting: Family Medicine

## 2012-06-13 VITALS — BP 139/78 | HR 80 | Wt 158.0 lb

## 2012-06-13 DIAGNOSIS — N951 Menopausal and female climacteric states: Secondary | ICD-10-CM

## 2012-06-13 DIAGNOSIS — R232 Flushing: Secondary | ICD-10-CM

## 2012-06-13 DIAGNOSIS — G5620 Lesion of ulnar nerve, unspecified upper limb: Secondary | ICD-10-CM

## 2012-06-13 MED ORDER — MELOXICAM 15 MG PO TABS: 15.0000 mg | ORAL_TABLET | Freq: Every day | ORAL | Status: DC

## 2012-06-13 NOTE — Telephone Encounter (Signed)
Medication sent by Dr. Linford Arnold.

## 2012-06-13 NOTE — Telephone Encounter (Signed)
Patient request to have a refill sent to her pharmacy of Meloxicam 15mg . Thanks

## 2012-06-13 NOTE — Progress Notes (Signed)
  Subjective:    Patient ID: Valerie Carroll, female    DOB: October 07, 1956, 56 y.o.   MRN: [id number]  HPI Left arm numbness is slightly better. Says middle finger is better but stillwith numbness in the 4th and 5th digits. Now having pain at the base of the cervical spine. Mobic is helping.   She is concerned about her hot flashes. She denies any mood changes or irritability. She also denies any vaginal dryness. There she's not sleeping that well. She says she's been having a lot of dreams at night but she's also had some significant stressors. She wants to know if she can take for her hot flashes. She is most interested in hormone replacement therapy. Her mother had a history of breast cancer.   Review of Systems     Objective:   Physical Exam  Constitutional: She appears well-developed and well-nourished.  Musculoskeletal:       Neck with normal flexion and extension. Normal side bending to the right. Decreased to the left compared to the right. Normal rotation right and left but slightly decreased on the left. She is nontender over the cervical spine today. Shoulders with normal range of motion. Wrist with normal range of motion. Pedal pulse 2+ bilaterally on the left. No pain in the shoulder with internal and external rotation.  Skin: Skin is warm and dry.  Psychiatric: She has a normal mood and affect. Her behavior is normal.          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 She's not having any problems with her elbow per se. She is right-handed. The Mobic seems to be helping so I did refill her prescription. She is already had an x-ray which shows some degenerative disc disease from C5-T1. We discussed the option of considering physical therapy for her neck versus seeing sports medicine or orthopedics. She says at this time she would like to try seeing the sports med discuss options. She is open to physical  therapy.  Hot flashes-we discussed different options. She can certainly try black cohosh which is over-the-counter. She feels her hot flashes are more severe and intense. We discussed that because she has a family history of breast cancer that she would not be a great candidate for HRT. Her mother had breast cancer. I recommend using one of the mood medication such as Effexor. She started on Lexapro. She says her mood is very well controlled. We may consider switching off the Lexapro and change to Effexor. She will then drop the Lexapro down to 10 mg while she is on vacation.

## 2012-06-13 NOTE — Patient Instructions (Signed)
Cut lexapro in half.  Take half tab daily

## 2012-06-19 ENCOUNTER — Telehealth: Payer: Self-pay | Admitting: Family Medicine

## 2012-06-19 NOTE — Telephone Encounter (Signed)
Call patient: Please let her know that they're actually some good studies looking at a medication called gabapentin, the brand is called Neurontin, for hot flashes and menopausal symptoms. We can add this to the Lexapro that she or he takes for mood. The other option would be to wean the Lexapro and change to Effexor. Please see if she has a preference. We could always try the Neurontin with the Lexapro for a couple months and if it's not helping more successful then we could always switch to the Effexor. Please let me know she would like to do.

## 2012-06-20 NOTE — Telephone Encounter (Signed)
Left detailed message on vm with reccomendations and  for pt to call back

## 2012-06-25 ENCOUNTER — Telehealth: Payer: Self-pay | Admitting: *Deleted

## 2012-06-25 MED ORDER — GABAPENTIN 100 MG PO CAPS: 100.0000 mg | ORAL_CAPSULE | Freq: Every day | ORAL | Status: DC

## 2012-06-25 NOTE — Telephone Encounter (Signed)
Will call in rx. Start with 1 tab at bedtime. Can increase to 2 tabs at bedtime in one week if tolerating well. Med can be taken up to TID but recommend advance slowly bc can cause sleepiness.

## 2012-06-25 NOTE — Telephone Encounter (Signed)
Pt called stating she just got back from out of town and states she received the message about starting her on Neurontin for hot flashes and menopausal. Pt states she would like to try this.

## 2012-06-25 NOTE — Telephone Encounter (Signed)
LMOM

## 2012-06-26 ENCOUNTER — Encounter: Payer: Self-pay | Admitting: Sports Medicine

## 2012-06-26 ENCOUNTER — Ambulatory Visit (INDEPENDENT_AMBULATORY_CARE_PROVIDER_SITE_OTHER): Payer: BC Managed Care – PPO | Admitting: Sports Medicine

## 2012-06-26 VITALS — BP 142/79 | HR 76 | Temp 97.8°F | Resp 18 | Wt 161.0 lb

## 2012-06-26 DIAGNOSIS — M5412 Radiculopathy, cervical region: Secondary | ICD-10-CM

## 2012-06-26 NOTE — Progress Notes (Signed)
Subjective:    I'm seeing this patient as a consultation for:  Dr. Linford Arnold  CC: Left arm numbness and tingling  HPI: Julitza is a very pleasant 56 year old female comes in with about a month and a half history of pain in her neck, that is radiating down her arms down to her last 3 fingers of her left arm. She denies any recent trauma. She saw Dr. Linford Arnold, who prescribed some prednisone, meloxicam, and she is just about to start gabapentin. Overall this is helped, but her symptoms are still present, on a daily basis. She does have a history of a rotator cuff tear, but denies any pain with overhead activities. She also denies any loss of bowel, or bladder function. The sensation is described as numbness, tingling, and burning. Occasionally she will get sharp pains shooting all the way down the arm to the last 3 fingers. She's never had physical therapy, and has never had an MRI.  Past medical history, Surgical history, Family history, Social history, Allergies, and medications have been entered into the medical record, reviewed, and no changes needed.   Review of Systems: No headache, visual changes, nausea, vomiting, diarrhea, constipation, dizziness, abdominal pain, skin rash, fevers, chills, night sweats, weight loss, body aches, joint swelling, muscle aches, chest pain, or shortness of breath.   Objective:   Vitals:  Afebrile, vital signs stable. General: Well Developed, well nourished, and in no acute distress.  Neuro/Psych: Alert and oriented x3, extra-ocular muscles intact, able to move all 4 extremities.  Skin: Warm and dry, no rashes noted.  Respiratory: Not using accessory muscles, speaking in full sentences, trachea midline.  Cardiovascular: Pulses palpable, no extremity edema. Abdomen: Does not appear distended. Neck: Inspection unremarkable. No palpable stepoffs. Negative Spurling's maneuver. Full neck range of motion Grip strength and sensation normal in bilateral  hands Strength good C4 to T1 distribution No sensory change to C4 to T1 Negative Hoffman sign bilaterally Reflexes normal  I did review her x-ray, it shows multilevel degenerative changes predominantly of the discs, but she also appears to have significant facet arthrosis at multiple levels. The cervical spine is also straight suggestive of spasm.  Impression and Recommendations:   This case required medical decision making of moderate complexity.

## 2012-06-26 NOTE — Assessment & Plan Note (Signed)
With multilevel degenerative disc, as well as facet arthrosis is likely age related. I do not think this is related to prior trauma. Present for a month and a half. Meloxicam is helping. I will start her on formal physical therapy. She will start the Neurontin up taper which I think is appropriate. All see her back in 4 weeks, and if no better will obtain an MRI for injection planning.

## 2012-07-01 ENCOUNTER — Ambulatory Visit: Payer: BC Managed Care – PPO | Attending: Sports Medicine | Admitting: Physical Therapy

## 2012-07-01 DIAGNOSIS — R293 Abnormal posture: Secondary | ICD-10-CM | POA: Insufficient documentation

## 2012-07-01 DIAGNOSIS — M2550 Pain in unspecified joint: Secondary | ICD-10-CM | POA: Insufficient documentation

## 2012-07-01 DIAGNOSIS — M6281 Muscle weakness (generalized): Secondary | ICD-10-CM | POA: Insufficient documentation

## 2012-07-01 DIAGNOSIS — IMO0001 Reserved for inherently not codable concepts without codable children: Secondary | ICD-10-CM | POA: Insufficient documentation

## 2012-07-04 ENCOUNTER — Ambulatory Visit: Payer: BC Managed Care – PPO | Admitting: Physical Therapy

## 2012-07-08 ENCOUNTER — Ambulatory Visit: Payer: BC Managed Care – PPO | Admitting: Physical Therapy

## 2012-07-11 ENCOUNTER — Ambulatory Visit: Payer: BC Managed Care – PPO | Attending: Sports Medicine | Admitting: Physical Therapy

## 2012-07-11 DIAGNOSIS — IMO0001 Reserved for inherently not codable concepts without codable children: Secondary | ICD-10-CM | POA: Insufficient documentation

## 2012-07-11 DIAGNOSIS — R293 Abnormal posture: Secondary | ICD-10-CM | POA: Insufficient documentation

## 2012-07-11 DIAGNOSIS — M2550 Pain in unspecified joint: Secondary | ICD-10-CM | POA: Insufficient documentation

## 2012-07-11 DIAGNOSIS — M6281 Muscle weakness (generalized): Secondary | ICD-10-CM | POA: Insufficient documentation

## 2012-07-21 ENCOUNTER — Other Ambulatory Visit: Payer: Self-pay | Admitting: Family Medicine

## 2012-07-26 ENCOUNTER — Encounter: Payer: Self-pay | Admitting: Sports Medicine

## 2012-07-26 ENCOUNTER — Ambulatory Visit (INDEPENDENT_AMBULATORY_CARE_PROVIDER_SITE_OTHER): Payer: BC Managed Care – PPO | Admitting: Sports Medicine

## 2012-07-26 VITALS — BP 134/70 | Wt 161.0 lb

## 2012-07-26 DIAGNOSIS — IMO0001 Reserved for inherently not codable concepts without codable children: Secondary | ICD-10-CM

## 2012-07-26 DIAGNOSIS — M5412 Radiculopathy, cervical region: Secondary | ICD-10-CM

## 2012-07-26 DIAGNOSIS — M7918 Myalgia, other site: Secondary | ICD-10-CM | POA: Insufficient documentation

## 2012-07-26 MED ORDER — GABAPENTIN 300 MG PO CAPS: ORAL_CAPSULE | ORAL | Status: DC

## 2012-07-26 NOTE — Progress Notes (Signed)
Subjective:    CC:  Followup   HPI: Cervical radiculopathy: status post approximately 4 sessions of formal physical therapy, gabapentin taper,  muscle relaxers, and steroids. Overall her symptoms have improved, particularly at night with gabapentin.    Chronic pain: She describes a long history of multiple painful locations on her neck, back, legs, associated with muscle stiffness. She has already had a workup that included rheumatoid factor, lupus, as well as tickborne illnesses. She is wondering if gabapentin will help this as well.  Hot flashes: Gabapentin is also helping this.  Past medical history, Surgical history, Family history, Social history, Allergies, and medications have been entered into the medical record, reviewed, and no changes needed.   Review of Systems: No headache, visual changes, nausea, vomiting, diarrhea, constipation, dizziness, abdominal pain, skin rash, fevers, chills, night sweats, weight loss, swollen lymph nodes, body aches, joint swelling, muscle aches, chest pain, or shortness of breath.   Objective:   Vitals:  Afebrile, vital signs stable. General: Well Developed, well nourished, and in no acute distress.  Neuro/Psych: Alert and oriented x3, extra-ocular muscles intact, able to move all 4 extremities.  Skin: Warm and dry, no rashes noted.  Respiratory: Not using accessory muscles, speaking in full sentences, trachea midline.  Cardiovascular: Pulses palpable, no extremity edema. Abdomen: Does not appear distended.  Impression and Recommendations:   This case required medical decision making of moderate complexity.

## 2012-07-26 NOTE — Assessment & Plan Note (Signed)
Gabapentin up-taper as above.

## 2012-07-26 NOTE — Assessment & Plan Note (Addendum)
Symptoms overall greatly improved with a combination of physical therapy, as well as gabapentin. She is only on a very low dose, 200 mg at night, and wakes up symptom-free. I think is appropriate for her to increase her dose, and take it during the daytime. This is also quite effective for her myofascial pain, as well as hot flashes.  Certainly we discussed MRI for epidural injection planning, and she prefers a medical approach initially.

## 2012-07-31 ENCOUNTER — Encounter: Payer: Self-pay | Admitting: Family Medicine

## 2012-07-31 ENCOUNTER — Ambulatory Visit (INDEPENDENT_AMBULATORY_CARE_PROVIDER_SITE_OTHER): Payer: BC Managed Care – PPO | Admitting: Family Medicine

## 2012-07-31 ENCOUNTER — Telehealth: Payer: Self-pay | Admitting: *Deleted

## 2012-07-31 VITALS — BP 125/78 | HR 79 | Ht 60.0 in | Wt 161.0 lb

## 2012-07-31 DIAGNOSIS — M5412 Radiculopathy, cervical region: Secondary | ICD-10-CM

## 2012-07-31 DIAGNOSIS — E8881 Metabolic syndrome: Secondary | ICD-10-CM

## 2012-07-31 DIAGNOSIS — Z0000 Encounter for general adult medical examination without abnormal findings: Secondary | ICD-10-CM

## 2012-07-31 LAB — POCT GLYCOSYLATED HEMOGLOBIN (HGB A1C): Hemoglobin A1C: 5.9

## 2012-07-31 MED ORDER — GABAPENTIN 300 MG PO CAPS: ORAL_CAPSULE | ORAL | Status: DC

## 2012-07-31 NOTE — Progress Notes (Signed)
  Subjective:     Valerie Carroll is a 56 y.o. female and is here for a comprehensive physical exam. The patient reports no problems.Neurontin helping some more with hotflahses.   History   Social History  . Marital Status: Married    Spouse Name: Leonette Most    Number of Children: 2  . Years of Education: 14   Occupational History  .     Social History Main Topics  . Smoking status: Current Every Day Smoker -- 1.0 packs/day for 30 years    Types: Cigarettes  . Smokeless tobacco: Not on file  . Alcohol Use: Yes     1 a day  . Drug Use: No  . Sexually Active: Not on file   Other Topics Concern  . Not on file   Social History Narrative   1 drink caffiene daily.  No regular exercise.      Health Maintenance  Topic Date Due  . Influenza Vaccine  06/09/2012  . Mammogram  11/27/2012  . Pap Smear  11/19/2014  . Tetanus/tdap  03/01/2016  . Colonoscopy  06/09/2016    The following portions of the patient's history were reviewed and updated as appropriate: allergies, current medications, past family history, past medical history, past social history, past surgical history and problem list.  Review of Systems A comprehensive review of systems was negative.   Objective:    BP 125/78  Pulse 79  Ht 5' (1.524 m)  Wt 161 lb (73.029 kg)  BMI 31.44 kg/m2 General appearance: alert, cooperative and appears stated age Head: Normocephalic, without obvious abnormality, atraumatic Eyes: conj clear, EOMi, PEERLA Ears: normal TM's and external ear canals both ears Nose: Nares normal. Septum midline. Mucosa normal. No drainage or sinus tenderness. Throat: lips, mucosa, and tongue normal; teeth and gums normal Neck: no adenopathy, no carotid bruit, no JVD, supple, symmetrical, trachea midline and thyroid not enlarged, symmetric, no tenderness/mass/nodules Back: symmetric, no curvature. ROM normal. No CVA tenderness. Lungs: clear to auscultation bilaterally Breasts: normal appearance, no masses or  tenderness Heart: regular rate and rhythm, S1, S2 normal, no murmur, click, rub or gallop Abdomen: soft, non-tender; bowel sounds normal; no masses,  no organomegaly Extremities: extremities normal, atraumatic, no cyanosis or edema Pulses: 2+ and symmetric Skin: Skin color, texture, turgor normal. No rashes or lesions Lymph nodes: Cervical, supraclavicular, and axillary nodes normal. Neurologic: Alert and oriented X 3, normal strength and tone. Normal symmetric reflexes. Normal coordination and gait    Assessment:    Healthy female exam.      Plan:     See After Visit Summary for Counseling Recommendations  Start a regular exercise program and make sure you are eating a healthy diet Try to eat 4 servings of dairy a day or take a calcium supplement (500mg  twice a day). Your vaccines are up to date.  Mammogram is up-to-date. Colonoscopy is up-to-date. Pap smear is up-to-date. Lab work was done in August. Copy printed and given to patient.

## 2012-07-31 NOTE — Patient Instructions (Signed)
Start a regular exercise program and make sure you are eating a healthy diet Try to eat 4 servings of dairy a day  Your vaccines are up to date.   

## 2012-08-21 NOTE — Telephone Encounter (Signed)
Error - see other note

## 2012-09-16 ENCOUNTER — Ambulatory Visit: Payer: BC Managed Care – PPO | Admitting: Sports Medicine

## 2012-09-18 ENCOUNTER — Encounter: Payer: Self-pay | Admitting: Sports Medicine

## 2012-09-18 ENCOUNTER — Ambulatory Visit (INDEPENDENT_AMBULATORY_CARE_PROVIDER_SITE_OTHER): Payer: BC Managed Care – PPO | Admitting: Sports Medicine

## 2012-09-18 VITALS — BP 139/80 | HR 79 | Ht 60.0 in | Wt 164.0 lb

## 2012-09-18 DIAGNOSIS — IMO0001 Reserved for inherently not codable concepts without codable children: Secondary | ICD-10-CM

## 2012-09-18 DIAGNOSIS — M7918 Myalgia, other site: Secondary | ICD-10-CM

## 2012-09-18 DIAGNOSIS — M5412 Radiculopathy, cervical region: Secondary | ICD-10-CM

## 2012-09-18 DIAGNOSIS — Z23 Encounter for immunization: Secondary | ICD-10-CM

## 2012-09-18 MED ORDER — PREGABALIN 75 MG PO CAPS: 75.0000 mg | ORAL_CAPSULE | Freq: Two times a day (BID) | ORAL | Status: DC

## 2012-09-18 NOTE — Assessment & Plan Note (Signed)
Unfortunately, she has stopped the Lexapro. Gabapentin is not a therapeutic dose. She is going to restart Lexapro, and we are going to try Lyrica. We will discontinue gabapentin.

## 2012-09-18 NOTE — Progress Notes (Signed)
SPORTS MEDICINE CONSULTATION REPORT  Subjective:    CC: Followup  HPI:  Cervical radiculitis: Overall symptoms are better with gabapentin, but not resolved. She does not desire to pursue MRI at this time.  Myofascial pain syndrome: Worsening: Gabapentin has not been efficacious, she has tried duloxetine without benefit. She has recently come off of her Lexapro electively, and her pain is increased. She's wondering if Lyrica might be possible.  Past medical history, Surgical history, Family history, Social history, Allergies, and medications have been entered into the medical record, reviewed, and no changes needed.   Review of Systems: No headache, visual changes, nausea, vomiting, diarrhea, constipation, dizziness, abdominal pain, skin rash, fevers, chills, night sweats, weight loss, swollen lymph nodes, body aches, joint swelling, muscle aches, chest pain, shortness of breath, mood changes, visual or auditory hallucinations.   Objective:   Vitals:  Afebrile, vital signs stable. General: Well Developed, well nourished, and in no acute distress.  Neuro/Psych: Alert and oriented x3, extra-ocular muscles intact, able to move all 4 extremities.  Skin: Warm and dry, no rashes noted.  Respiratory: Not using accessory muscles, speaking in full sentences, trachea midline.  Cardiovascular: Pulses palpable, no extremity edema. Abdomen: Does not appear distended.  Impression and Recommendations:   This case required medical decision making of moderate complexity.

## 2012-09-18 NOTE — Assessment & Plan Note (Signed)
Symptoms are currently bearable. We will revisit this at a further date.

## 2012-10-08 ENCOUNTER — Other Ambulatory Visit: Payer: Self-pay | Admitting: *Deleted

## 2012-10-08 MED ORDER — ALPRAZOLAM 0.5 MG PO TABS: 0.5000 mg | ORAL_TABLET | Freq: Two times a day (BID) | ORAL | Status: DC | PRN

## 2012-10-16 ENCOUNTER — Ambulatory Visit: Payer: BC Managed Care – PPO | Admitting: Sports Medicine

## 2012-10-19 ENCOUNTER — Other Ambulatory Visit: Payer: Self-pay | Admitting: Family Medicine

## 2012-11-28 ENCOUNTER — Other Ambulatory Visit: Payer: Self-pay | Admitting: *Deleted

## 2012-11-28 MED ORDER — ESCITALOPRAM OXALATE 20 MG PO TABS: 20.0000 mg | ORAL_TABLET | Freq: Every day | ORAL | Status: DC

## 2012-11-28 NOTE — Telephone Encounter (Signed)
Med refilled.

## 2012-12-14 ENCOUNTER — Other Ambulatory Visit: Payer: Self-pay | Admitting: Family Medicine

## 2012-12-31 ENCOUNTER — Ambulatory Visit: Payer: BC Managed Care – PPO | Admitting: Family Medicine

## 2013-01-06 ENCOUNTER — Encounter: Payer: Self-pay | Admitting: Family Medicine

## 2013-01-06 ENCOUNTER — Ambulatory Visit (INDEPENDENT_AMBULATORY_CARE_PROVIDER_SITE_OTHER): Payer: BC Managed Care – PPO | Admitting: Family Medicine

## 2013-01-06 ENCOUNTER — Other Ambulatory Visit (HOSPITAL_COMMUNITY)
Admission: RE | Admit: 2013-01-06 | Discharge: 2013-01-06 | Disposition: A | Discharge status: Home / Self Care | Payer: BC Managed Care – PPO | Source: Ambulatory Visit | Attending: Family Medicine | Admitting: Family Medicine

## 2013-01-06 VITALS — BP 130/70 | HR 94 | Wt 164.0 lb

## 2013-01-06 DIAGNOSIS — Z124 Encounter for screening for malignant neoplasm of cervix: Secondary | ICD-10-CM

## 2013-01-06 DIAGNOSIS — Z01419 Encounter for gynecological examination (general) (routine) without abnormal findings: Secondary | ICD-10-CM | POA: Insufficient documentation

## 2013-01-06 NOTE — Patient Instructions (Signed)
We will call with the Pap smear results once available.

## 2013-01-06 NOTE — Progress Notes (Signed)
  Subjective:    Patient ID: Valerie Carroll, female    DOB: January 17, 1956, 57 y.o.   MRN: [id number]  HPI Here today for Pap smear only. Her physical was back in October. She denies any pelvic pain or discharge. She has had some point pain over both groin creases. She does a lot of heavy lifting at work and thinks may have strained something. She's not noticed any bulging. No prior history of hernia.   Review of Systems     Objective:   Physical Exam  Abdominal: Hernia confirmed negative in the right inguinal area and confirmed negative in the left inguinal area.  Genitourinary: Vagina normal and uterus normal. There is no rash, tenderness, lesion or injury on the right labia. There is no rash, tenderness, lesion or injury on the left labia. Uterus is not deviated, not enlarged, not fixed and not tender. Cervix exhibits no motion tenderness, no discharge and no friability. Right adnexum displays no mass, no tenderness and no fullness. Left adnexum displays no mass, no tenderness and no fullness. No tenderness around the vagina. No signs of injury around the vagina. No vaginal discharge found.  Lymphadenopathy:       Right: No inguinal adenopathy present.       Left: No inguinal adenopathy present.          Assessment & Plan:  Pap smear-sample collected. We will call her with the results once they're available. She can followup in the fall for her regular physical. She has no lymphadenopathy or tenderness of the groin creases. This is mostly where her pain is. She says over all to be getting better. If it doesn't continue to resolve or improve or suddenly gets worsening pressure followup and make an appointment. 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  Hypertension-well-controlled today. Continue current regimen.

## 2013-01-08 ENCOUNTER — Encounter: Payer: Self-pay | Admitting: Family Medicine

## 2013-04-11 ENCOUNTER — Other Ambulatory Visit: Payer: Self-pay | Admitting: Family Medicine

## 2013-06-12 ENCOUNTER — Other Ambulatory Visit: Payer: Self-pay | Admitting: Family Medicine

## 2013-07-10 ENCOUNTER — Other Ambulatory Visit: Payer: Self-pay | Admitting: Family Medicine

## 2013-08-02 ENCOUNTER — Other Ambulatory Visit: Payer: Self-pay | Admitting: Family Medicine

## 2013-08-20 ENCOUNTER — Other Ambulatory Visit: Payer: Self-pay | Admitting: Family Medicine

## 2013-10-08 ENCOUNTER — Other Ambulatory Visit: Payer: Self-pay | Admitting: Family Medicine

## 2013-10-08 ENCOUNTER — Telehealth: Payer: Self-pay | Admitting: *Deleted

## 2013-10-08 NOTE — Telephone Encounter (Signed)
LMOM for pt to contact office due to only being able to send 2 weeks worth of medication to pharmacy because she is passed due for f/u appt and need to know if she wants it sent to local pharmacy [facility].  Meyer Cory, LPN

## 2013-11-16 ENCOUNTER — Other Ambulatory Visit: Payer: Self-pay | Admitting: Family Medicine

## 2013-11-17 NOTE — Telephone Encounter (Signed)
Called patient on 11/17/2013 and LMOM to call office and schedule appointment.  No further refills will be given until schedules office visit. Clemetine Marker, LPN

## 2013-12-18 ENCOUNTER — Ambulatory Visit (INDEPENDENT_AMBULATORY_CARE_PROVIDER_SITE_OTHER): Payer: BC Managed Care – PPO | Admitting: Family Medicine

## 2013-12-18 ENCOUNTER — Encounter: Payer: Self-pay | Admitting: Family Medicine

## 2013-12-18 VITALS — BP 111/67 | HR 75 | Temp 98.0°F | Ht 65.0 in | Wt 162.0 lb

## 2013-12-18 DIAGNOSIS — M899 Disorder of bone, unspecified: Secondary | ICD-10-CM

## 2013-12-18 DIAGNOSIS — M949 Disorder of cartilage, unspecified: Secondary | ICD-10-CM

## 2013-12-18 DIAGNOSIS — F411 Generalized anxiety disorder: Secondary | ICD-10-CM

## 2013-12-18 DIAGNOSIS — E348 Other specified endocrine disorders: Secondary | ICD-10-CM

## 2013-12-18 DIAGNOSIS — E785 Hyperlipidemia, unspecified: Secondary | ICD-10-CM

## 2013-12-18 DIAGNOSIS — M8580 Other specified disorders of bone density and structure, unspecified site: Secondary | ICD-10-CM

## 2013-12-18 DIAGNOSIS — I10 Essential (primary) hypertension: Secondary | ICD-10-CM

## 2013-12-18 LAB — POCT GLYCOSYLATED HEMOGLOBIN (HGB A1C): Hemoglobin A1C: 5.9

## 2013-12-18 MED ORDER — ESCITALOPRAM OXALATE 20 MG PO TABS: ORAL_TABLET | ORAL | Status: DC

## 2013-12-18 MED ORDER — ALPRAZOLAM 0.5 MG PO TABS: 0.5000 mg | ORAL_TABLET | Freq: Two times a day (BID) | ORAL | Status: DC | PRN

## 2013-12-18 MED ORDER — SIMVASTATIN 20 MG PO TABS: ORAL_TABLET | ORAL | Status: DC

## 2013-12-18 MED ORDER — LOSARTAN POTASSIUM-HCTZ 50-12.5 MG PO TABS: ORAL_TABLET | ORAL | Status: DC
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
## 2013-12-19 LAB — COMPLETE METABOLIC PANEL WITH GFR
ALT: 35 U/L (ref 0–35)
AST: 26 U/L (ref 0–37)
Albumin: 3.8 g/dL (ref 3.5–5.2)
Alkaline Phosphatase: 68 U/L (ref 39–117)
BILIRUBIN TOTAL: 0.6 mg/dL (ref 0.2–1.2)
BUN: 12 mg/dL (ref 6–23)
CO2: 30 mEq/L (ref 19–32)
Calcium: 8.9 mg/dL (ref 8.4–10.5)
Chloride: 97 mEq/L (ref 96–112)
Creat: 0.6 mg/dL (ref 0.50–1.10)
GLUCOSE: 82 mg/dL (ref 70–99)
Potassium: 3.4 mEq/L — ABNORMAL LOW (ref 3.5–5.3)
Sodium: 136 mEq/L (ref 135–145)
Total Protein: 6.7 g/dL (ref 6.0–8.3)

## 2013-12-19 LAB — LIPID PANEL
CHOLESTEROL: 166 mg/dL (ref 0–200)
HDL: 49 mg/dL (ref >39)
LDL Cholesterol: 78 mg/dL (ref 0–99)
Total CHOL/HDL Ratio: 3.4 Ratio
Triglycerides: 194 mg/dL — ABNORMAL HIGH (ref <150)
VLDL: 39 mg/dL (ref 0–40)

## 2013-12-22 ENCOUNTER — Other Ambulatory Visit: Payer: Self-pay | Admitting: *Deleted

## 2013-12-22 DIAGNOSIS — E875 Hyperkalemia: Secondary | ICD-10-CM

## 2014-01-27 LAB — POTASSIUM: Potassium: 3.8 mEq/L (ref 3.5–5.3)

## 2014-03-10 ENCOUNTER — Other Ambulatory Visit: Payer: Self-pay

## 2014-03-10 MED ORDER — ESCITALOPRAM OXALATE 20 MG PO TABS: ORAL_TABLET | ORAL | Status: DC

## 2014-03-10 NOTE — Telephone Encounter (Signed)
Sent prescriptions to [facility].

## 2014-05-24 ENCOUNTER — Other Ambulatory Visit: Payer: Self-pay | Admitting: Family Medicine

## 2014-07-15 ENCOUNTER — Other Ambulatory Visit: Payer: Self-pay | Admitting: Family Medicine

## 2014-07-30 ENCOUNTER — Ambulatory Visit (INDEPENDENT_AMBULATORY_CARE_PROVIDER_SITE_OTHER): Payer: BC Managed Care – PPO | Admitting: Family Medicine

## 2014-07-30 ENCOUNTER — Encounter: Payer: Self-pay | Admitting: Family Medicine

## 2014-07-30 VITALS — BP 146/79 | HR 84 | Ht 65.0 in | Wt 162.0 lb

## 2014-07-30 DIAGNOSIS — E785 Hyperlipidemia, unspecified: Secondary | ICD-10-CM

## 2014-07-30 DIAGNOSIS — E8881 Metabolic syndrome: Secondary | ICD-10-CM

## 2014-07-30 LAB — POCT GLYCOSYLATED HEMOGLOBIN (HGB A1C): Hemoglobin A1C: 6

## 2014-07-30 MED ORDER — VENLAFAXINE HCL ER 37.5 MG PO CP24: ORAL_CAPSULE | ORAL | Status: DC

## 2014-07-30 MED ORDER — ALPRAZOLAM 0.5 MG PO TABS: 0.5000 mg | ORAL_TABLET | Freq: Two times a day (BID) | ORAL | Status: DC | PRN

## 2014-07-30 MED ORDER — SIMVASTATIN 20 MG PO TABS: ORAL_TABLET | ORAL | Status: DC

## 2014-07-30 MED ORDER — LOSARTAN POTASSIUM-HCTZ 50-12.5 MG PO TABS: ORAL_TABLET | ORAL | Status: DC

## 2014-07-30 MED ORDER — ESCITALOPRAM OXALATE 20 MG PO TABS: ORAL_TABLET | ORAL | Status: DC

## 2014-07-30 NOTE — Patient Instructions (Signed)
Lexapro-Take half tab daily for 7-8 days, then 1/2 tab every other day for 8 days.   Then you can start the new medication - venlafaxine.
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
## 2014-09-10 ENCOUNTER — Ambulatory Visit (INDEPENDENT_AMBULATORY_CARE_PROVIDER_SITE_OTHER): Payer: BC Managed Care – PPO | Admitting: Family Medicine

## 2014-09-10 ENCOUNTER — Encounter: Payer: Self-pay | Admitting: Family Medicine

## 2014-09-10 VITALS — BP 134/71 | HR 102 | Wt 163.0 lb

## 2014-09-10 DIAGNOSIS — F418 Other specified anxiety disorders: Secondary | ICD-10-CM

## 2014-09-10 MED ORDER — VENLAFAXINE HCL ER 150 MG PO CP24: 150.0000 mg | ORAL_CAPSULE | Freq: Every day | ORAL | Status: DC

## 2014-09-10 NOTE — Progress Notes (Signed)
   Subjective:    Patient ID: Valerie Carroll, female    DOB: [birth year], 58 y.o.   MRN: [id number]  HPI Depression and anxiety - Doing better on the medication. No S.E. on the medications.  Sleep is getting progresively better.  Feels much less irritabile. She has had some stress recently because the cost of her health insurance is going up. She does still report feeling down and nervous several days of the week. No thoughts of harming herself. No negative thoughts about herself. No significant changes in appetite. She does complain of some mild fatigue and difficulty relaxing.   Review of Systems     Objective:   Physical Exam  Constitutional: She is oriented to person, place, and time. She appears well-developed and well-nourished.  HENT:  Head: Normocephalic and atraumatic.  Cardiovascular: Normal rate, regular rhythm and normal heart sounds.   Pulmonary/Chest: Effort normal and breath sounds normal.  Neurological: She is alert and oriented to person, place, and time.  Skin: Skin is warm and dry.  Psychiatric: She has a normal mood and affect. Her behavior is normal.          Assessment & Plan:  Depression anxiety - Doing well but feels like there is still some room for improvement.  Increase Effexor to 150mg .  F/U in 2 months. PHQ 9 score is 4 today, previous was 15. Gad 7 score of 6 today, previous of 14. She rates her symptoms as not difficult at all.

## 2014-10-06 ENCOUNTER — Encounter: Payer: Self-pay | Admitting: Family Medicine

## 2014-10-06 ENCOUNTER — Ambulatory Visit (INDEPENDENT_AMBULATORY_CARE_PROVIDER_SITE_OTHER): Payer: BC Managed Care – PPO | Admitting: Family Medicine

## 2014-10-06 VITALS — BP 123/75 | HR 93 | Wt 166.0 lb

## 2014-10-06 DIAGNOSIS — F418 Other specified anxiety disorders: Secondary | ICD-10-CM

## 2014-10-06 MED ORDER — FLUOXETINE HCL 10 MG PO CAPS: ORAL_CAPSULE | ORAL | Status: DC

## 2014-10-06 NOTE — Progress Notes (Signed)
   Subjective:    Patient ID: Valerie Carroll, female    DOB: 09-May-1956, 58 y.o.   MRN: [id number]  HPI Follow-up depression and anxiety-we had increased her Effexor 250 mg about 4 weeks ago. She tapered herself completely off the medication because she felt like it was giving her symptoms without subtle thoughts. She did not contact our office about the side effects or concerns. Today she rates her anxiety levels as quite high as well as depression levels. She feels nervous and on edge nearly every day and feels down depressed and hopeless nearly every day. Was also having nightmares on Effexor. She has been off the medication for 5-6 days. Having brain zaps, nauseated since stopping the medication.  Feels like urination has reduced but drinking plenty of fluids.  Having more intense hotflashes.  She's also been under a lot of stressors recently and thinks this may have triggered her symptoms. She's having financial stressors and didn't have her family come visit for the holidays which was upsetting.  Review of Systems     Objective:   Physical Exam  Constitutional: She is oriented to person, place, and time. She appears well-developed and well-nourished.  HENT:  Head: Normocephalic and atraumatic.  Neurological: She is alert and oriented to person, place, and time.  Skin: Skin is warm and dry.  Psychiatric: She has a normal mood and affect. Her behavior is normal.          Assessment & Plan:  Depression/anxiety-gad 7 score of 19 today and PHQ 9 score of 23. Previous was 4. Discussed options with her.her sxs are uncontrolled at this time. We discussed options of going back to Lexapro which is what she was previously on her trying something different. We will avoid an SSRI in our eye. Will try fluoxetine instead. Start with 10 mg for 5 days and then increase to 20 mg. I want to see her back in 34 weeks to see how well she is doing. She is also scheduled for a physical. She is not interested  in therapy/counseling at this time.  Time spent 25 minutes, greater 50% time spent counseling about depression and anxiety and options for treatment.

## 2014-10-19 ENCOUNTER — Telehealth: Payer: Self-pay | Admitting: *Deleted

## 2014-10-19 NOTE — Telephone Encounter (Signed)
Pt called and lvm indicating that the fluoxetine has been causing her to have headaches and make her feel weird she would like to go back to lexapro. Fwd to pcp for advice.Audelia Hives [place]

## 2014-10-20 MED ORDER — ESCITALOPRAM OXALATE 10 MG PO TABS
10.0000 mg | ORAL_TABLET | Freq: Every day | ORAL | Status: DC
Start: 2014-10-20 — End: 2014-11-13

## 2014-10-20 NOTE — Telephone Encounter (Signed)
Okay. Will send over new prescription for Lexapro.

## 2014-10-21 NOTE — Telephone Encounter (Signed)
Pt informed.Valerie Carroll  

## 2014-10-30 ENCOUNTER — Encounter: Payer: BC Managed Care – PPO | Admitting: Family Medicine

## 2014-11-13 ENCOUNTER — Ambulatory Visit (INDEPENDENT_AMBULATORY_CARE_PROVIDER_SITE_OTHER): Payer: No Typology Code available for payment source | Admitting: Family Medicine

## 2014-11-13 ENCOUNTER — Other Ambulatory Visit (HOSPITAL_COMMUNITY)
Admission: RE | Admit: 2014-11-13 | Discharge: 2014-11-13 | Disposition: A | Discharge status: Home / Self Care | Payer: No Typology Code available for payment source | Source: Ambulatory Visit | Attending: Family Medicine | Admitting: Family Medicine

## 2014-11-13 ENCOUNTER — Encounter: Payer: Self-pay | Admitting: Family Medicine

## 2014-11-13 VITALS — BP 111/65 | HR 80 | Ht 65.0 in | Wt 163.0 lb

## 2014-11-13 DIAGNOSIS — Z01419 Encounter for gynecological examination (general) (routine) without abnormal findings: Secondary | ICD-10-CM

## 2014-11-13 DIAGNOSIS — Z1151 Encounter for screening for human papillomavirus (HPV): Secondary | ICD-10-CM | POA: Diagnosis present

## 2014-11-13 DIAGNOSIS — Z0000 Encounter for general adult medical examination without abnormal findings: Secondary | ICD-10-CM | POA: Diagnosis not present

## 2014-11-13 DIAGNOSIS — Z124 Encounter for screening for malignant neoplasm of cervix: Secondary | ICD-10-CM

## 2014-11-13 MED ORDER — ESCITALOPRAM OXALATE 20 MG PO TABS: 20.0000 mg | ORAL_TABLET | Freq: Every day | ORAL | Status: DC

## 2014-11-13 NOTE — Patient Instructions (Signed)
Keep up a regular exercise program and make sure you are eating a healthy diet Try to eat 4 servings of dairy a day, or if you are lactose intolerant take a calcium with vitamin D daily.  Your vaccines are up to date.   

## 2014-11-13 NOTE — Progress Notes (Signed)
Subjective:     Valerie Carroll is a 59 y.o. female and is here for a comprehensive physical exam. The patient reports no problems.  History   Social History  . Marital Status: Married    Spouse Name: Juanda Crumble    Number of Children: 2  . Years of Education: 14   Occupational History  .     Social History Main Topics  . Smoking status: Current Every Day Smoker -- 1.00 packs/day for 30 years    Types: Cigarettes  . Smokeless tobacco: Not on file  . Alcohol Use: Yes     Comment: 1 a day  . Drug Use: No  . Sexual Activity: Not on file   Other Topics Concern  . Not on file   Social History Narrative   1 drink caffiene daily.  No regular exercise.      Health Maintenance  Topic Date Due  . HIV Screening  03/18/1970  . MAMMOGRAM  12/25/2013  . INFLUENZA VACCINE  01/07/2015 (Originally 05/09/2014)  . PAP SMEAR  01/07/2016  . COLONOSCOPY  06/09/2016  . TETANUS/TDAP  07/22/2022    The following portions of the patient's history were reviewed and updated as appropriate: allergies, current medications, past family history, past medical history, past social history, past surgical history and problem list.  Review of Systems A comprehensive review of systems was negative.   Objective:    BP 111/65 mmHg  Pulse 80  Ht 5\' 5"  (1.651 m)  Wt 163 lb (73.936 kg)  BMI 27.12 kg/m2 General appearance: alert, cooperative and appears stated age Head: Normocephalic, without obvious abnormality, atraumatic Eyes: conj clear, EOMI, PEERLA Ears: normal TM's and external ear canals both ears Nose: Nares normal. Septum midline. Mucosa normal. No drainage or sinus tenderness. Throat: lips, mucosa, and tongue normal; teeth and gums normal Neck: no adenopathy, no carotid bruit, no JVD, supple, symmetrical, trachea midline and thyroid not enlarged, symmetric, no tenderness/mass/nodules Back: symmetric, no curvature. ROM normal. No CVA tenderness. Lungs: clear to auscultation bilaterally Breasts:  normal appearance, no masses or tenderness Heart: regular rate and rhythm, S1, S2 normal, no murmur, click, rub or gallop Abdomen: soft, non-tender; bowel sounds normal; no masses,  no organomegaly Pelvic: cervix normal in appearance, external genitalia normal, no adnexal masses or tenderness, no cervical motion tenderness, rectovaginal septum normal, uterus normal size, shape, and consistency and vagina normal without discharge Extremities: extremities normal, atraumatic, no cyanosis or edema Pulses: 2+ and symmetric Skin: Skin color, texture, turgor normal. No rashes or lesions Lymph nodes: Cervical, supraclavicular, and axillary nodes normal. Neurologic: Alert and oriented X 3, normal strength and tone. Normal symmetric reflexes. Normal coordination and gait    Assessment:    Healthy female exam.      Plan:     See After Visit Summary for Counseling Recommendations  Keep up a regular exercise program and make sure you are eating a healthy diet Try to eat 4 servings of dairy a day, or if you are lactose intolerant take a calcium with vitamin D daily.  Your vaccines are up to date.  Pap smear performed today. We'll call with results once available.F  Anxiety-she is happy being back on her Lexapro. We had tried Prozac as well as his Effexor and unfortunately she had side effects. She is now back on the 20 mg dose would like a prescription sent for the full tab strength. Her PHQ 9 score was 8 today and Gad 7 score was 5. Is actually significant  improvement to where her previous PHQ 9 score was 23 and previous Gad 7 score was 19.  F/U in 4 months.

## 2014-11-14 LAB — CBC
HEMATOCRIT: 39.3 % (ref 36.0–46.0)
Hemoglobin: 13.3 g/dL (ref 12.0–15.0)
MCH: 32.3 pg (ref 26.0–34.0)
MCHC: 33.8 g/dL (ref 30.0–36.0)
MCV: 95.4 fL (ref 78.0–100.0)
MPV: 9.4 fL (ref 8.6–12.4)
Platelets: 272 10*3/uL (ref 150–400)
RBC: 4.12 MIL/uL (ref 3.87–5.11)
RDW: 13.5 % (ref 11.5–15.5)
WBC: 6.6 10*3/uL (ref 4.0–10.5)

## 2014-11-14 LAB — COMPLETE METABOLIC PANEL WITH GFR
ALK PHOS: 61 U/L (ref 39–117)
ALT: 35 U/L (ref 0–35)
AST: 27 U/L (ref 0–37)
Albumin: 4.2 g/dL (ref 3.5–5.2)
BUN: 12 mg/dL (ref 6–23)
CHLORIDE: 97 meq/L (ref 96–112)
CO2: 30 meq/L (ref 19–32)
Calcium: 9.1 mg/dL (ref 8.4–10.5)
Creat: 0.56 mg/dL (ref 0.50–1.10)
GFR, Est African American: >89 mL/min
GFR, Est Non African American: >89 mL/min
GLUCOSE: 80 mg/dL (ref 70–99)
Potassium: 3.5 mEq/L (ref 3.5–5.3)
Sodium: 136 mEq/L (ref 135–145)
Total Bilirubin: 0.5 mg/dL (ref 0.2–1.2)
Total Protein: 6.9 g/dL (ref 6.0–8.3)

## 2014-11-14 LAB — LIPID PANEL
CHOL/HDL RATIO: 3.8 ratio
CHOLESTEROL: 183 mg/dL (ref 0–200)
HDL: 48 mg/dL (ref >39)
LDL CALC: 102 mg/dL — AB (ref 0–99)
Triglycerides: 167 mg/dL — ABNORMAL HIGH (ref <150)
VLDL: 33 mg/dL (ref 0–40)

## 2014-11-16 ENCOUNTER — Other Ambulatory Visit: Payer: Self-pay | Admitting: Family Medicine

## 2014-11-16 MED ORDER — SIMVASTATIN 40 MG PO TABS: 40.0000 mg | ORAL_TABLET | Freq: Every day | ORAL | Status: DC

## 2014-11-18 LAB — CYTOLOGY - PAP

## 2014-11-19 NOTE — Progress Notes (Signed)
Quick Note:  Call patient: Your Pap smear is normal. Repeat in 5 years. ______ 

## 2014-11-22 ENCOUNTER — Other Ambulatory Visit: Payer: Self-pay | Admitting: Family Medicine

## 2014-11-23 ENCOUNTER — Other Ambulatory Visit: Payer: Self-pay | Admitting: Family Medicine

## 2014-11-23 DIAGNOSIS — Z9289 Personal history of other medical treatment: Secondary | ICD-10-CM

## 2014-11-25 ENCOUNTER — Ambulatory Visit (INDEPENDENT_AMBULATORY_CARE_PROVIDER_SITE_OTHER): Payer: No Typology Code available for payment source

## 2014-11-25 DIAGNOSIS — Z9289 Personal history of other medical treatment: Secondary | ICD-10-CM

## 2014-11-25 DIAGNOSIS — Z1231 Encounter for screening mammogram for malignant neoplasm of breast: Secondary | ICD-10-CM

## 2014-11-30 ENCOUNTER — Telehealth: Payer: Self-pay | Admitting: *Deleted

## 2014-11-30 NOTE — Telephone Encounter (Signed)
-----   Message from Hali Marry, MD sent at 11/26/2014  5:36 PM EST ----- Please call patient. Normal mammogram.  Repeat in 1 year.

## 2014-11-30 NOTE — Telephone Encounter (Signed)
[phone] (Home) [phone] (Mobile)  Left VM about results

## 2014-12-29 ENCOUNTER — Other Ambulatory Visit: Payer: Self-pay | Admitting: *Deleted

## 2014-12-29 MED ORDER — LOSARTAN POTASSIUM-HCTZ 50-12.5 MG PO TABS: ORAL_TABLET | ORAL | Status: DC

## 2015-04-02 ENCOUNTER — Other Ambulatory Visit: Payer: Self-pay | Admitting: Family Medicine

## 2015-05-27 ENCOUNTER — Ambulatory Visit (INDEPENDENT_AMBULATORY_CARE_PROVIDER_SITE_OTHER): Payer: No Typology Code available for payment source | Admitting: Family Medicine

## 2015-05-27 ENCOUNTER — Encounter: Payer: Self-pay | Admitting: Family Medicine

## 2015-05-27 VITALS — BP 123/73 | HR 89 | Ht 65.0 in | Wt 168.0 lb

## 2015-05-27 DIAGNOSIS — Z114 Encounter for screening for human immunodeficiency virus [HIV]: Secondary | ICD-10-CM

## 2015-05-27 DIAGNOSIS — Z1159 Encounter for screening for other viral diseases: Secondary | ICD-10-CM | POA: Diagnosis not present

## 2015-05-27 DIAGNOSIS — I10 Essential (primary) hypertension: Secondary | ICD-10-CM | POA: Diagnosis not present

## 2015-05-27 DIAGNOSIS — M25571 Pain in right ankle and joints of right foot: Secondary | ICD-10-CM

## 2015-05-27 DIAGNOSIS — F411 Generalized anxiety disorder: Secondary | ICD-10-CM

## 2015-05-27 DIAGNOSIS — E8881 Metabolic syndrome: Secondary | ICD-10-CM | POA: Diagnosis not present

## 2015-05-27 LAB — POCT GLYCOSYLATED HEMOGLOBIN (HGB A1C): Hemoglobin A1C: 5.8

## 2015-05-27 MED ORDER — ALPRAZOLAM 0.5 MG PO TABS: 0.5000 mg | ORAL_TABLET | Freq: Two times a day (BID) | ORAL | Status: DC | PRN

## 2015-05-27 MED ORDER — ESCITALOPRAM OXALATE 20 MG PO TABS: 20.0000 mg | ORAL_TABLET | Freq: Every day | ORAL | Status: DC

## 2015-05-27 MED ORDER — LOSARTAN POTASSIUM-HCTZ 50-12.5 MG PO TABS: ORAL_TABLET | ORAL | Status: DC

## 2015-05-27 MED ORDER — SIMVASTATIN 40 MG PO TABS: 40.0000 mg | ORAL_TABLET | Freq: Every day | ORAL | Status: DC

## 2015-05-27 NOTE — Progress Notes (Signed)
   Subjective:    Patient ID: Valerie Carroll, female    DOB: 23-Jan-1956, 59 y.o.   MRN: [id number]  HPI  Hypertension- Pt denies chest pain, SOB, dizziness, or heart palpitations.  Taking meds as directed w/o problems.  Denies medication side effects.    F/U anxiety - dong well on escitalopram. She is still on the xanax BID.  She is doing well. Patient complaint of little interest or pleasure doing things several days a week and feeling down several days of the week. She denies any thoughts of 1 to harm herself. Previous PHQ 9 was 8. She does complain of feeling nervous several days of the week and having difficulty relaxing. Previous CAD 7 was 5. She rates her symptoms as not difficult at all.  IFG- no inc thrist or urination.   Lab Results  Component Value Date   HGBA1C 6.0 07/30/2014    Review of Systems     Objective:   Physical Exam  Constitutional: She is oriented to person, place, and time. She appears well-developed and well-nourished.  HENT:  Head: Normocephalic and atraumatic.  Cardiovascular: Normal rate, regular rhythm and normal heart sounds.   No carotid bruits  Pulmonary/Chest: Effort normal and breath sounds normal.  Musculoskeletal: She exhibits edema.  Trace edema in the right ankle.  Neurological: She is alert and oriented to person, place, and time.  Skin: Skin is warm and dry.  Psychiatric: She has a normal mood and affect. Her behavior is normal.          Assessment & Plan:  Anxiety - controlled. She is happy with her current regimen.  F/U in 6 months   HTN - well controlled.  Continue current regimen. Follow up in 6 months.  Impaired fasting glucose-A1c of 5.8 which looks great. We'll continue to follow and repeat in 6 months. Continue work on diet and exercise.  Right ankle pain-she reports that she twisted both of her ankles when she went to visit her daughter in [place]. She still getting some swelling on the right side. She has done this many  times and said already has a boot at home and has been wearing it recently. She feels like it's been getting better. She declines any further workup today by encouraged her to call or come back in if she feels like it's not healing as she would expect.

## 2015-05-28 LAB — BASIC METABOLIC PANEL WITH GFR
BUN: 13 mg/dL (ref 7–25)
CALCIUM: 9.2 mg/dL (ref 8.6–10.4)
CO2: 32 mmol/L — ABNORMAL HIGH (ref 20–31)
CREATININE: 0.56 mg/dL (ref 0.50–0.99)
Chloride: 100 mmol/L (ref 98–110)
GFR, Est Non African American: >89 mL/min (ref >=60)
GLUCOSE: 105 mg/dL — AB (ref 65–99)
Potassium: 3.3 mmol/L — ABNORMAL LOW (ref 3.5–5.3)
Sodium: 140 mmol/L (ref 135–146)

## 2015-05-28 LAB — HIV ANTIBODY (ROUTINE TESTING W REFLEX): HIV: NONREACTIVE

## 2015-05-28 LAB — HEPATITIS C ANTIBODY: HCV AB: NEGATIVE

## 2015-06-01 ENCOUNTER — Other Ambulatory Visit: Payer: Self-pay | Admitting: *Deleted

## 2015-06-01 DIAGNOSIS — E876 Hypokalemia: Secondary | ICD-10-CM

## 2015-07-13 LAB — POTASSIUM: POTASSIUM: 4 mmol/L (ref 3.5–5.3)

## 2015-09-27 ENCOUNTER — Encounter: Payer: Self-pay | Admitting: Family Medicine

## 2015-09-27 ENCOUNTER — Ambulatory Visit (INDEPENDENT_AMBULATORY_CARE_PROVIDER_SITE_OTHER): Payer: No Typology Code available for payment source | Admitting: Family Medicine

## 2015-09-27 VITALS — BP 144/71 | HR 101 | Temp 97.8°F | Wt 167.0 lb

## 2015-09-27 DIAGNOSIS — J019 Acute sinusitis, unspecified: Secondary | ICD-10-CM

## 2015-09-27 DIAGNOSIS — J209 Acute bronchitis, unspecified: Secondary | ICD-10-CM | POA: Diagnosis not present

## 2015-09-27 DIAGNOSIS — F17200 Nicotine dependence, unspecified, uncomplicated: Secondary | ICD-10-CM

## 2015-09-27 MED ORDER — HYDROCODONE-HOMATROPINE 5-1.5 MG/5ML PO SYRP: 5.0000 mL | ORAL_SOLUTION | Freq: Every evening | ORAL | Status: DC | PRN

## 2015-09-27 MED ORDER — AZITHROMYCIN 250 MG PO TABS: ORAL_TABLET | ORAL | Status: AC

## 2015-09-27 NOTE — Progress Notes (Signed)
   Subjective:    Patient ID: Valerie Carroll, female    DOB: [birth year], 59 y.o.   MRN: [id number]  HPI  Had a cold around thanksgiving and says she is still coughing and not feeling well.  ( x 3 weeks).   Had some congestion initially.   No fever or chills.  No ST. No HA or facial pressure. She is exhausted.  Taking Mucinex but not helping.  + wheezing.  No SOB.   Review of Systems     Objective:   Physical Exam  Constitutional: She is oriented to person, place, and time. She appears well-developed and well-nourished.  HENT:  Head: Normocephalic and atraumatic.  Right Ear: External ear normal.  Left Ear: External ear normal.  Nose: Nose normal.  Mouth/Throat: Oropharynx is clear and moist.  TMs and canals are clear.   Eyes: Conjunctivae and EOM are normal. Pupils are equal, round, and reactive to light.  Neck: Neck supple. No thyromegaly present.  Cardiovascular: Normal rate, regular rhythm and normal heart sounds.   Pulmonary/Chest: Effort normal and breath sounds normal. She has no wheezes.  Lymphadenopathy:    She has no cervical adenopathy.  Neurological: She is alert and oriented to person, place, and time.  Skin: Skin is warm and dry.  Psychiatric: She has a normal mood and affect.          Assessment & Plan:  Acute bronchitis/sinusitis - will tx with zpack and cough syrup at bedtime. Call if not better in one week.  She is a heavy smoker so I am concerned about a bacterial infection.     tob abuse - encouraged cessation.

## 2016-03-03 ENCOUNTER — Encounter: Payer: Self-pay | Admitting: Family Medicine

## 2016-03-03 ENCOUNTER — Ambulatory Visit (INDEPENDENT_AMBULATORY_CARE_PROVIDER_SITE_OTHER): Payer: No Typology Code available for payment source | Admitting: Family Medicine

## 2016-03-03 VITALS — BP 137/64 | HR 80 | Wt 163.0 lb

## 2016-03-03 DIAGNOSIS — F17200 Nicotine dependence, unspecified, uncomplicated: Secondary | ICD-10-CM

## 2016-03-03 DIAGNOSIS — E785 Hyperlipidemia, unspecified: Secondary | ICD-10-CM | POA: Diagnosis not present

## 2016-03-03 DIAGNOSIS — E8881 Metabolic syndrome: Secondary | ICD-10-CM

## 2016-03-03 DIAGNOSIS — I10 Essential (primary) hypertension: Secondary | ICD-10-CM

## 2016-03-03 DIAGNOSIS — F411 Generalized anxiety disorder: Secondary | ICD-10-CM | POA: Diagnosis not present

## 2016-03-03 LAB — COMPLETE METABOLIC PANEL WITH GFR
ALBUMIN: 4.3 g/dL (ref 3.6–5.1)
ALK PHOS: 66 U/L (ref 33–130)
ALT: 32 U/L — AB (ref 6–29)
AST: 25 U/L (ref 10–35)
BUN: 14 mg/dL (ref 7–25)
CALCIUM: 9.3 mg/dL (ref 8.6–10.4)
CO2: 31 mmol/L (ref 20–31)
CREATININE: 0.75 mg/dL (ref 0.50–0.99)
Chloride: 96 mmol/L — ABNORMAL LOW (ref 98–110)
GFR, Est African American: >89 mL/min (ref >=60)
GFR, Est Non African American: 87 mL/min (ref >=60)
GLUCOSE: 86 mg/dL (ref 65–99)
POTASSIUM: 3.5 mmol/L (ref 3.5–5.3)
SODIUM: 136 mmol/L (ref 135–146)
TOTAL PROTEIN: 7 g/dL (ref 6.1–8.1)
Total Bilirubin: 0.5 mg/dL (ref 0.2–1.2)

## 2016-03-03 LAB — LIPID PANEL
CHOL/HDL RATIO: 3.8 ratio (ref <=5.0)
CHOLESTEROL: 196 mg/dL (ref 125–200)
HDL: 52 mg/dL (ref >=46)
LDL Cholesterol: 95 mg/dL (ref <130)
Triglycerides: 243 mg/dL — ABNORMAL HIGH (ref <150)
VLDL: 49 mg/dL — ABNORMAL HIGH (ref <30)

## 2016-03-03 LAB — POCT GLYCOSYLATED HEMOGLOBIN (HGB A1C): HEMOGLOBIN A1C: 6

## 2016-03-03 MED ORDER — LOSARTAN POTASSIUM-HCTZ 50-12.5 MG PO TABS: ORAL_TABLET | ORAL | Status: DC

## 2016-03-03 MED ORDER — ALPRAZOLAM 0.5 MG PO TABS: 0.5000 mg | ORAL_TABLET | Freq: Two times a day (BID) | ORAL | Status: DC | PRN

## 2016-03-03 MED ORDER — ESCITALOPRAM OXALATE 20 MG PO TABS: 20.0000 mg | ORAL_TABLET | Freq: Every day | ORAL | Status: DC

## 2016-03-03 MED ORDER — SIMVASTATIN 40 MG PO TABS: 40.0000 mg | ORAL_TABLET | Freq: Every day | ORAL | Status: DC

## 2016-03-03 NOTE — Progress Notes (Signed)
Subjective:    CC: Anxiety   HPI:  GAD- Last seen 10 months ago for her anxiety. She's currently on Lexapro 20 mg daily and uses Xanax as needed.  Hypertension- Pt denies chest pain, SOB, dizziness, or heart palpitations.  Taking meds as directed w/o problems.  Denies medication side effects.  She has been exercising regularly.    IFG- no inc thirst or urination.    Hyperlipidemia-currently on simvastatin without any side effects. No myalgias.  Past medical history, Surgical history, Family history not pertinant except as noted below, Social history, Allergies, and medications have been entered into the medical record, reviewed, and corrections made.   Review of Systems: No fevers, chills, night sweats, weight loss, chest pain, or shortness of breath.   Objective:    General: Well Developed, well nourished, and in no acute distress.  Neuro: Alert and oriented x3, extra-ocular muscles intact, sensation grossly intact.  HEENT: Normocephalic, atraumatic  Skin: Warm and dry, no rashes. Cardiac: Regular rate and rhythm, no murmurs rubs or gallops, no lower extremity edema.  Respiratory: Clear to auscultation bilaterally. Not using accessory muscles, speaking in full sentences.   Impression and Recommendations:   GAD- WEll controlled.  GAD- 7 score of 8 today. Had a stressful day today.    HTN - Well controlled. Continue current regimen. Follow up in   IFG- A1C of 6.0.  Good work. She has been exercising regularly.    Hyperlipidemia-due to recheck lipid panel. Continue simvastatin 40 mg.  Tobacco abuse- Encourage cessation.   She plans on mammogram next fall.

## 2016-07-28 ENCOUNTER — Ambulatory Visit (INDEPENDENT_AMBULATORY_CARE_PROVIDER_SITE_OTHER): Payer: No Typology Code available for payment source | Admitting: Family Medicine

## 2016-07-28 ENCOUNTER — Encounter: Payer: Self-pay | Admitting: Family Medicine

## 2016-07-28 VITALS — BP 137/66 | HR 78 | Ht 65.0 in | Wt 166.0 lb

## 2016-07-28 DIAGNOSIS — N6321 Unspecified lump in the left breast, upper outer quadrant: Secondary | ICD-10-CM | POA: Diagnosis not present

## 2016-07-28 DIAGNOSIS — Z0000 Encounter for general adult medical examination without abnormal findings: Secondary | ICD-10-CM

## 2016-07-28 DIAGNOSIS — E8881 Metabolic syndrome: Secondary | ICD-10-CM

## 2016-07-28 DIAGNOSIS — Z1231 Encounter for screening mammogram for malignant neoplasm of breast: Secondary | ICD-10-CM | POA: Diagnosis not present

## 2016-07-28 LAB — BASIC METABOLIC PANEL WITH GFR
BUN: 10 mg/dL (ref 7–25)
CALCIUM: 9.5 mg/dL (ref 8.6–10.4)
CO2: 30 mmol/L (ref 20–31)
Chloride: 99 mmol/L (ref 98–110)
Creat: 0.7 mg/dL (ref 0.50–0.99)
GLUCOSE: 101 mg/dL — AB (ref 65–99)
POTASSIUM: 3.9 mmol/L (ref 3.5–5.3)
SODIUM: 138 mmol/L (ref 135–146)

## 2016-07-28 LAB — POCT GLYCOSYLATED HEMOGLOBIN (HGB A1C): HEMOGLOBIN A1C: 5.9

## 2016-07-28 LAB — TSH: TSH: 1.1 m[IU]/L

## 2016-07-28 NOTE — Patient Instructions (Signed)
Keep up a regular exercise program and make sure you are eating a healthy diet Try to eat 4 servings of dairy a day, or if you are lactose intolerant take a calcium with vitamin D daily.  Your vaccines are up to date.   

## 2016-07-28 NOTE — Addendum Note (Signed)
Addended by: Teddy Spike on: 07/28/2016 04:37 PM   Modules accepted: Orders

## 2016-07-28 NOTE — Progress Notes (Signed)
Subjective:     Valerie Carroll is a 60 y.o. female and is here for a comprehensive physical exam. The patient reports problems - has had some soreness in the left lateral breast. felt like it was coming from her shoulder as she has been donig some heavy lifting..  Social History   Social History  . Marital status: Married    Spouse name: Juanda Crumble  . Number of children: 2  . Years of education: 14   Occupational History  .  [employer]   Social History Main Topics  . Smoking status: Current Every Day Smoker    Packs/day: 1.00    Years: 30.00    Types: Cigarettes  . Smokeless tobacco: Not on file  . Alcohol use Yes     Comment: 1 a day  . Drug use: No  . Sexual activity: Not on file   Other Topics Concern  . Not on file   Social History Narrative   1 drink caffiene daily.  No regular exercise.      Health Maintenance  Topic Date Due  . MAMMOGRAM  11/26/2015  . INFLUENZA VACCINE  05/09/2016  . COLONOSCOPY  06/09/2016  . PAP SMEAR  11/13/2017  . TETANUS/TDAP  07/22/2022  . ZOSTAVAX  Completed  . Hepatitis C Screening  Completed  . HIV Screening  Completed    The following portions of the patient's history were reviewed and updated as appropriate: allergies, current medications, past family history, past medical history, past social history, past surgical history and problem list.  Review of Systems A comprehensive review of systems was negative.   Objective:    BP 137/66   Pulse 78   Ht 5\' 5"  (1.651 m)   Wt 166 lb (75.3 kg)   SpO2 98%   BMI 27.62 kg/m  General appearance: alert, cooperative and appears stated age Head: Normocephalic, without obvious abnormality, atraumatic Eyes: conj clear, EOMI, PEERLA Ears: normal TM's and external ear canals both ears Nose: Nares normal. Septum midline. Mucosa normal. No drainage or sinus tenderness. Throat: lips, mucosa, and tongue normal; teeth and gums normal Neck: no adenopathy, no carotid bruit, no JVD, supple,  symmetrical, trachea midline and thyroid not enlarged, symmetric, no tenderness/mass/nodules Back: symmetric, no curvature. ROM normal. No CVA tenderness. Lungs: clear to auscultation bilaterally Breasts: breast lump in left br at the 2 o'clock position, approx 2.5 cm from edge of areola that is oval shaped 1.5 x 3 cm. Right breast with normal exam. Heart: regular rate and rhythm, S1, S2 normal, no murmur, click, rub or gallop Abdomen: soft, non-tender; bowel sounds normal; no masses,  no organomegaly Extremities: extremities normal, atraumatic, no cyanosis or edema Pulses: 2+ and symmetric Skin: Skin color, texture, turgor normal. No rashes or lesions Lymph nodes: Cervical, supraclavicular, and axillary nodes normal. Neurologic: Alert and oriented X 3, normal strength and tone. Normal symmetric reflexes. Normal coordination and gait    Assessment:    Healthy female exam.      Plan:     See After Visit Summary for Counseling Recommendations   Keep up a regular exercise program and make sure you are eating a healthy diet Try to eat 4 servings of dairy a day, or if you are lactose intolerant take a calcium with vitamin D daily.  Your vaccines are up to date.   Left breast lump. Her for diagnostic imaging an ultrasound to [facility] which is where she gets her regular mammograms.

## 2016-07-30 NOTE — Progress Notes (Signed)
All labs are normal. 

## 2016-08-08 ENCOUNTER — Other Ambulatory Visit: Payer: Self-pay

## 2016-08-08 ENCOUNTER — Other Ambulatory Visit: Payer: Self-pay | Admitting: Family Medicine

## 2016-08-08 DIAGNOSIS — Z1239 Encounter for other screening for malignant neoplasm of breast: Secondary | ICD-10-CM

## 2016-08-08 DIAGNOSIS — N6320 Unspecified lump in the left breast, unspecified quadrant: Secondary | ICD-10-CM

## 2016-08-08 NOTE — Addendum Note (Signed)
Addended by: Teddy Spike on: 08/08/2016 08:53 AM   Modules accepted: Orders

## 2016-08-09 ENCOUNTER — Other Ambulatory Visit: Payer: Self-pay | Admitting: Family Medicine

## 2016-08-14 ENCOUNTER — Ambulatory Visit
Admission: RE | Admit: 2016-08-14 | Discharge: 2016-08-14 | Disposition: A | Discharge status: Home / Self Care | Payer: No Typology Code available for payment source | Source: Ambulatory Visit | Attending: Family Medicine | Admitting: Family Medicine

## 2016-08-14 DIAGNOSIS — N6320 Unspecified lump in the left breast, unspecified quadrant: Secondary | ICD-10-CM

## 2016-08-14 DIAGNOSIS — Z1239 Encounter for other screening for malignant neoplasm of breast: Secondary | ICD-10-CM

## 2016-09-07 ENCOUNTER — Other Ambulatory Visit: Payer: Self-pay | Admitting: Family Medicine

## 2016-09-07 DIAGNOSIS — I10 Essential (primary) hypertension: Secondary | ICD-10-CM

## 2016-09-07 DIAGNOSIS — E785 Hyperlipidemia, unspecified: Secondary | ICD-10-CM

## 2016-09-07 DIAGNOSIS — F411 Generalized anxiety disorder: Secondary | ICD-10-CM

## 2016-12-26 ENCOUNTER — Ambulatory Visit (INDEPENDENT_AMBULATORY_CARE_PROVIDER_SITE_OTHER): Payer: PRIVATE HEALTH INSURANCE | Admitting: Family Medicine

## 2016-12-26 DIAGNOSIS — M545 Low back pain, unspecified: Secondary | ICD-10-CM | POA: Insufficient documentation

## 2016-12-26 MED ORDER — CYCLOBENZAPRINE HCL 10 MG PO TABS: 10.0000 mg | ORAL_TABLET | Freq: Three times a day (TID) | ORAL | 0 refills | Status: DC | PRN

## 2016-12-26 NOTE — Patient Instructions (Signed)
Thank you for coming in today. Take ibuprofen.  Use flexeril as needed.  Use a heating pad.  Attend PT.  Come back or go to the emergency room if you notice new weakness new numbness problems walking or bowel or bladder problems.  TENS UNIT: This is helpful for muscle pain and spasm.   Search and Purchase a TENS 7000 2nd edition at  www.tenspros.com or www.[employer].com It should be less than $30.     TENS unit instructions: Do not shower or bathe with the unit on Turn the unit off before removing electrodes or batteries If the electrodes lose stickiness add a drop of water to the electrodes after they are disconnected from the unit and place on plastic sheet. If you continued to have difficulty, call the TENS unit company to purchase more electrodes. Do not apply lotion on the skin area prior to use. Make sure the skin is clean and dry as this will help prolong the life of the electrodes. After use, always check skin for unusual red areas, rash or other skin difficulties. If there are any skin problems, does not apply electrodes to the same area. Never remove the electrodes from the unit by pulling the wires. Do not use the TENS unit or electrodes other than as directed. Do not change electrode placement without consultating your therapist or physician. Keep 2 fingers with between each electrode. Wear time ratio is 2:1, on to off times.    For example on for 30 minutes off for 15 minutes and then on for 30 minutes off for 15 minutes      Lumbosacral Strain Lumbosacral strain is an injury that causes pain in the lower back (lumbosacral spine). This injury usually occurs from overstretching the muscles or ligaments along your spine. A strain can affect one or more muscles or cord-like tissues that connect bones to other bones (ligaments). What are the causes? This condition may be caused by:  A hard, direct hit (blow) to the back.  Excessive stretching of the lower back muscles.  This may result from:  A fall.  Lifting something heavy.  Repetitive movements such as bending or crouching. What increases the risk? The following factors may increase your risk of getting this condition:  Participating in sports or activities that involve:  A sudden twist of the back.  Pushing or pulling motions.  Being overweight or obese.  Having poor strength and flexibility, especially tight hamstrings or weak muscles in the back or abdomen.  Having too much of a curve in the lower back.  Having a pelvis that is tilted forward. What are the signs or symptoms? The main symptom of this condition is pain in the lower back, at the site of the strain. Pain may extend (radiate) down one or both legs. How is this diagnosed? This condition is diagnosed based on:  Your symptoms.  Your medical history.  A physical exam.  Your health care provider may push on certain areas of your back to determine the source of your pain.  You may be asked to bend forward, backward, and side to side to assess the severity of your pain and your range of motion.  Imaging tests, such as:  X-rays.  MRI. How is this treated? Treatment for this condition may include:  Putting heat and cold on the affected area.  Medicines to help relieve pain and relax your muscles (muscle relaxants).  NSAIDs to help reduce swelling and discomfort. When your symptoms improve, it is important  to gradually return to your normal routine as soon as possible to reduce pain, avoid stiffness, and avoid loss of muscle strength. Generally, symptoms should improve within 6 weeks of treatment. However, recovery time varies. Follow these instructions at home: Managing pain, stiffness, and swelling    If directed, put ice on the injured area during the first 24 hours after your strain.  Put ice in a plastic bag.  Place a towel between your skin and the bag.  Leave the ice on for 20 minutes, 2-3 times a  day.  If directed, put heat on the affected area as often as told by your health care provider. Use the heat source that your health care provider recommends, such as a moist heat pack or a heating pad.  Place a towel between your skin and the heat source.  Leave the heat on for 20-30 minutes.  Remove the heat if your skin turns bright red. This is especially important if you are unable to feel pain, heat, or cold. You may have a greater risk of getting burned. Activity   Rest and return to your normal activities as told by your health care provider. Ask your health care provider what activities are safe for you.  Avoid activities that take a lot of energy for as long as told by your health care provider. General instructions   Take over-the-counter and prescription medicines only as told by your health care provider.  Donot drive or use heavy machinery while taking prescription pain medicine.  Do not use any products that contain nicotine or tobacco, such as cigarettes and e-cigarettes. If you need help quitting, ask your health care provider.  Keep all follow-up visits as told by your health care provider. This is important. How is this prevented?  Use correct form when playing sports and lifting heavy objects.  Use good posture when sitting and standing.  Maintain a healthy weight.  Sleep on a mattress with medium firmness to support your back.  Be safe and responsible while being active to avoid falls.  Do at least 150 minutes of moderate-intensity exercise each week, such as brisk walking or water aerobics. Try a form of exercise that takes stress off your back, such as swimming or stationary cycling.  Maintain physical fitness, including:  Strength.  Flexibility.  Cardiovascular fitness.  Endurance. Contact a health care provider if:  Your back pain does not improve after 6 weeks of treatment.  Your symptoms get worse. Get help right away if:  Your back  pain is severe.  You cannot stand or walk.  You have difficulty controlling when you urinate or when you have a bowel movement.  You feel nauseous or you vomit.  Your feet get very cold.  You have numbness, tingling, weakness, or problems using your arms or legs.  You develop any of the following:  Shortness of breath.  Dizziness.  Pain in your legs.  Weakness in your buttocks or legs.  Discoloration of the skin on your toes or legs. This information is not intended to replace advice given to you by your health care provider. Make sure you discuss any questions you have with your health care provider. Document Released: 07/05/2005 Document Revised: 04/14/2016 Document Reviewed: 02/27/2016 Elsevier Interactive Patient Education  2017 [employer].

## 2016-12-26 NOTE — Progress Notes (Signed)
   Subjective:    I'm seeing this patient as a consultation for:  METHENEY,CATHERINE, MD   CC: Back pain  HPI: Patient is a 3 week history of worsening low back pain. She's had multiple year-long history of intermittent chronic low back pain. This worsened a few weeks ago without particular injury. She notes that she thinks she may have injured it while picking up a heavy object but cannot recall a specific incident. She denies any radiating pain weakness or numbness. She's been trying ibuprofen and heating pad which has helped a little. She denies bowel bladder dysfunction weakness or numbness fevers or chills or difficulty walking. The pain is worse with activity and better with rest.  Past medical history, Surgical history, Family history not pertinant except as noted below, Social history, Allergies, and medications have been entered into the medical record, reviewed, and no changes needed.   Review of Systems: No headache, visual changes, nausea, vomiting, diarrhea, constipation, dizziness, abdominal pain, skin rash, fevers, chills, night sweats, weight loss, swollen lymph nodes, body aches, joint swelling, muscle aches, chest pain, shortness of breath, mood changes, visual or auditory hallucinations.   Objective:    Vitals:   12/26/16 1337  BP: (!) 143/66  Pulse: 89   General: Well Developed, well nourished, and in no acute distress.  Neuro/Psych: Alert and oriented x3, extra-ocular muscles intact, able to move all 4 extremities, sensation grossly intact. Skin: Warm and dry, no rashes noted.  Respiratory: Not using accessory muscles, speaking in full sentences, trachea midline.  Cardiovascular: Pulses palpable, no extremity edema. Abdomen: Does not appear distended. MSK: Lumbar spine: Nontender to midline. Tender palpation bilateral lumbar paraspinal muscles. Normal lumbar motion however patient does experience pain with extension. Lower extremity strength is intact. Reflexes are  intact bilateral knees and ankle. Sensation is intact throughout. Negative slump test bilaterally. Normal gait.  No results found for this or any previous visit (from the past 24 hour(s)). No results found.  Impression and Recommendations:    Assessment and Plan: 61 y.o. female with Lumbago without radiculopathy likely due to myofascial strain. Plan to treat with NSAIDs heating pad TENS unit Flexeril and referral to physical therapy. Recheck if not better.  Patient is overdue for colon cancer screening. We had a discussion and patient would like to try Cologuard. She will contact her health insurance company and if it is up her primary care provider or myself for order.  Patient declined influenza vaccine today  Discussed warning signs or symptoms. Please see discharge instructions. Patient expresses understanding.

## 2017-03-19 ENCOUNTER — Other Ambulatory Visit: Payer: Self-pay | Admitting: Family Medicine

## 2017-03-19 DIAGNOSIS — I10 Essential (primary) hypertension: Secondary | ICD-10-CM

## 2017-03-19 DIAGNOSIS — F411 Generalized anxiety disorder: Secondary | ICD-10-CM

## 2017-03-19 DIAGNOSIS — E785 Hyperlipidemia, unspecified: Secondary | ICD-10-CM

## 2017-04-19 ENCOUNTER — Ambulatory Visit (INDEPENDENT_AMBULATORY_CARE_PROVIDER_SITE_OTHER): Payer: PRIVATE HEALTH INSURANCE | Admitting: Family Medicine

## 2017-04-19 ENCOUNTER — Encounter: Payer: Self-pay | Admitting: Family Medicine

## 2017-04-19 VITALS — BP 140/60 | HR 70 | Wt 170.0 lb

## 2017-04-19 DIAGNOSIS — I10 Essential (primary) hypertension: Secondary | ICD-10-CM

## 2017-04-19 DIAGNOSIS — E8881 Metabolic syndrome: Secondary | ICD-10-CM

## 2017-04-19 DIAGNOSIS — E785 Hyperlipidemia, unspecified: Secondary | ICD-10-CM

## 2017-04-19 DIAGNOSIS — F411 Generalized anxiety disorder: Secondary | ICD-10-CM | POA: Diagnosis not present

## 2017-04-19 LAB — COMPLETE METABOLIC PANEL WITH GFR
ALBUMIN: 4.1 g/dL (ref 3.6–5.1)
ALK PHOS: 65 U/L (ref 33–130)
ALT: 52 U/L — ABNORMAL HIGH (ref 6–29)
AST: 34 U/L (ref 10–35)
BUN: 12 mg/dL (ref 7–25)
CALCIUM: 8.5 mg/dL — AB (ref 8.6–10.4)
CHLORIDE: 99 mmol/L (ref 98–110)
CO2: 30 mmol/L (ref 20–31)
Creat: 0.68 mg/dL (ref 0.50–0.99)
Glucose, Bld: 105 mg/dL — ABNORMAL HIGH (ref 65–99)
POTASSIUM: 3.8 mmol/L (ref 3.5–5.3)
Sodium: 138 mmol/L (ref 135–146)
Total Bilirubin: 0.3 mg/dL (ref 0.2–1.2)
Total Protein: 6.7 g/dL (ref 6.1–8.1)

## 2017-04-19 LAB — LIPID PANEL W/REFLEX DIRECT LDL
CHOL/HDL RATIO: 3.8 ratio (ref <5.0)
CHOLESTEROL: 188 mg/dL (ref <200)
HDL: 50 mg/dL — ABNORMAL LOW (ref >50)
LDL-Cholesterol: 106 mg/dL — ABNORMAL HIGH
Non-HDL Cholesterol (Calc): 138 mg/dL — ABNORMAL HIGH (ref <130)
TRIGLYCERIDES: 201 mg/dL — AB (ref <150)

## 2017-04-19 LAB — POCT GLYCOSYLATED HEMOGLOBIN (HGB A1C): Hemoglobin A1C: 5.9

## 2017-04-19 MED ORDER — ESCITALOPRAM OXALATE 20 MG PO TABS: 20.0000 mg | ORAL_TABLET | Freq: Every day | ORAL | 1 refills | Status: DC

## 2017-04-19 MED ORDER — ALPRAZOLAM 0.5 MG PO TABS: 0.5000 mg | ORAL_TABLET | Freq: Two times a day (BID) | ORAL | 0 refills | Status: DC | PRN

## 2017-04-19 MED ORDER — LOSARTAN POTASSIUM-HCTZ 50-12.5 MG PO TABS: 1.0000 | ORAL_TABLET | Freq: Every day | ORAL | 1 refills | Status: DC

## 2017-04-19 MED ORDER — SIMVASTATIN 40 MG PO TABS: 40.0000 mg | ORAL_TABLET | Freq: Every day | ORAL | 3 refills | Status: DC
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
## 2017-10-28 ENCOUNTER — Other Ambulatory Visit: Payer: Self-pay | Admitting: Family Medicine

## 2017-10-28 DIAGNOSIS — F411 Generalized anxiety disorder: Secondary | ICD-10-CM

## 2017-10-28 DIAGNOSIS — I10 Essential (primary) hypertension: Secondary | ICD-10-CM

## 2017-12-24 ENCOUNTER — Other Ambulatory Visit: Payer: Self-pay | Admitting: *Deleted

## 2017-12-24 ENCOUNTER — Ambulatory Visit: Payer: PRIVATE HEALTH INSURANCE | Admitting: Family Medicine

## 2017-12-24 ENCOUNTER — Encounter: Payer: Self-pay | Admitting: Family Medicine

## 2017-12-24 VITALS — BP 136/65 | HR 80 | Ht 65.0 in | Wt 166.0 lb

## 2017-12-24 DIAGNOSIS — R748 Abnormal levels of other serum enzymes: Secondary | ICD-10-CM | POA: Diagnosis not present

## 2017-12-24 DIAGNOSIS — F411 Generalized anxiety disorder: Secondary | ICD-10-CM | POA: Diagnosis not present

## 2017-12-24 DIAGNOSIS — E8881 Metabolic syndrome: Secondary | ICD-10-CM

## 2017-12-24 DIAGNOSIS — E785 Hyperlipidemia, unspecified: Secondary | ICD-10-CM

## 2017-12-24 DIAGNOSIS — I10 Essential (primary) hypertension: Secondary | ICD-10-CM

## 2017-12-24 LAB — POCT GLYCOSYLATED HEMOGLOBIN (HGB A1C): Hemoglobin A1C: 5.9

## 2017-12-24 MED ORDER — LOSARTAN POTASSIUM-HCTZ 50-12.5 MG PO TABS: 1.0000 | ORAL_TABLET | Freq: Every day | ORAL | 1 refills | Status: DC

## 2017-12-24 MED ORDER — ALPRAZOLAM 0.5 MG PO TABS: 0.5000 mg | ORAL_TABLET | Freq: Two times a day (BID) | ORAL | 0 refills | Status: DC | PRN

## 2017-12-24 MED ORDER — SIMVASTATIN 40 MG PO TABS: 40.0000 mg | ORAL_TABLET | Freq: Every day | ORAL | 3 refills | Status: DC

## 2017-12-24 MED ORDER — ESCITALOPRAM OXALATE 20 MG PO TABS: 20.0000 mg | ORAL_TABLET | Freq: Every day | ORAL | 3 refills | Status: DC

## 2017-12-24 NOTE — Addendum Note (Signed)
Addended by: Beatrice Lecher D on: 12/24/2017 03:15 PM   Modules accepted: Orders

## 2017-12-24 NOTE — Patient Instructions (Signed)
Please remember to schedule your mammogram
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
## 2017-12-24 NOTE — Addendum Note (Signed)
Addended by: Teddy Spike on: 12/24/2017 02:36 PM   Modules accepted: Orders

## 2018-02-21 LAB — COMPLETE METABOLIC PANEL WITH GFR
AG Ratio: 1.5 (calc) (ref 1.0–2.5)
ALBUMIN MSPROF: 4.4 g/dL (ref 3.6–5.1)
ALT: 36 U/L — ABNORMAL HIGH (ref 6–29)
AST: 28 U/L (ref 10–35)
Alkaline phosphatase (APISO): 71 U/L (ref 33–130)
BUN: 10 mg/dL (ref 7–25)
CALCIUM: 9.4 mg/dL (ref 8.6–10.4)
CO2: 30 mmol/L (ref 20–32)
Chloride: 99 mmol/L (ref 98–110)
Creat: 0.73 mg/dL (ref 0.50–0.99)
GFR, EST AFRICAN AMERICAN: 102 mL/min/{1.73_m2} (ref >=60)
GFR, EST NON AFRICAN AMERICAN: 88 mL/min/{1.73_m2} (ref >=60)
GLOBULIN: 3 g/dL (ref 1.9–3.7)
Glucose, Bld: 99 mg/dL (ref 65–99)
POTASSIUM: 3.9 mmol/L (ref 3.5–5.3)
SODIUM: 138 mmol/L (ref 135–146)
TOTAL PROTEIN: 7.4 g/dL (ref 6.1–8.1)
Total Bilirubin: 0.4 mg/dL (ref 0.2–1.2)

## 2018-05-08 ENCOUNTER — Encounter: Payer: Self-pay | Admitting: Emergency Medicine

## 2018-05-08 ENCOUNTER — Other Ambulatory Visit: Payer: Self-pay

## 2018-05-08 ENCOUNTER — Emergency Department (INDEPENDENT_AMBULATORY_CARE_PROVIDER_SITE_OTHER)
Admission: EM | Admit: 2018-05-08 | Discharge: 2018-05-08 | Disposition: A | Discharge status: Home / Self Care | Payer: PRIVATE HEALTH INSURANCE | Source: Home / Self Care | Attending: Family Medicine | Admitting: Family Medicine

## 2018-05-08 DIAGNOSIS — R0789 Other chest pain: Secondary | ICD-10-CM | POA: Diagnosis not present

## 2018-05-08 MED ORDER — OMEPRAZOLE 20 MG PO CPDR
40.0000 mg | DELAYED_RELEASE_CAPSULE | Freq: Every day | ORAL | 0 refills | Status: DC
Start: 2018-05-08 — End: 2018-09-19

## 2018-05-08 MED ORDER — GI COCKTAIL ~~LOC~~
30.0000 mL | Freq: Once | ORAL | Status: AC
  Administered 2018-05-08: 30 mL via ORAL
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
## 2018-05-08 NOTE — Discharge Instructions (Signed)
°  Please start taking the medication as prescribed. Please call to schedule a follow up appointment with Dr. Madilyn Fireman for recheck of symptoms early next week.  Call 911 or go to the hospital if symptoms worsening this evening or this weekend- increased pain, trouble breathing, or other new concerning symptoms develop.

## 2018-05-08 NOTE — ED Triage Notes (Signed)
Epigastric pain started 6 days ago, then a couple of days later, worse when she's laying down.

## 2018-07-22 ENCOUNTER — Other Ambulatory Visit: Payer: Self-pay | Admitting: Family Medicine

## 2018-07-22 DIAGNOSIS — I10 Essential (primary) hypertension: Secondary | ICD-10-CM

## 2018-09-19 ENCOUNTER — Ambulatory Visit: Payer: PRIVATE HEALTH INSURANCE | Admitting: Family Medicine

## 2018-09-19 ENCOUNTER — Encounter: Payer: Self-pay | Admitting: Family Medicine

## 2018-09-19 VITALS — BP 138/52 | HR 84 | Temp 97.5°F | Wt 165.3 lb

## 2018-09-19 DIAGNOSIS — F17200 Nicotine dependence, unspecified, uncomplicated: Secondary | ICD-10-CM

## 2018-09-19 DIAGNOSIS — F411 Generalized anxiety disorder: Secondary | ICD-10-CM | POA: Diagnosis not present

## 2018-09-19 DIAGNOSIS — E8881 Metabolic syndrome: Secondary | ICD-10-CM

## 2018-09-19 DIAGNOSIS — I10 Essential (primary) hypertension: Secondary | ICD-10-CM

## 2018-09-19 MED ORDER — ALPRAZOLAM 0.5 MG PO TABS: 0.5000 mg | ORAL_TABLET | Freq: Two times a day (BID) | ORAL | 0 refills | Status: DC | PRN

## 2018-09-19 MED ORDER — LOSARTAN POTASSIUM-HCTZ 50-12.5 MG PO TABS: 1.0000 | ORAL_TABLET | Freq: Every day | ORAL | 1 refills | Status: DC

## 2018-09-19 MED ORDER — ESCITALOPRAM OXALATE 20 MG PO TABS: 20.0000 mg | ORAL_TABLET | Freq: Every day | ORAL | 3 refills | Status: DC

## 2018-09-19 NOTE — Progress Notes (Signed)
Subjective:    CC: BP check up.    HPI:  Hypertension- Pt denies chest pain, SOB, dizziness, or heart palpitations.  Taking meds as directed w/o problems.  Denies medication side effects.    Impaired fasting glucose-no increased thirst or urination. No symptoms consistent with hypoglycemia.  F/U Tobacco abuse - Stil smoking. She plans on trying to quit.   F/U Anxiety -currently on Zoloft daily and using Xanax PRN.   Past medical history, Surgical history, Family history not pertinant except as noted below, Social history, Allergies, and medications have been entered into the medical record, reviewed, and corrections made.   Review of Systems: No fevers, chills, night sweats, weight loss, chest pain, or shortness of breath.   Objective:    General: Well Developed, well nourished, and in no acute distress.  Neuro: Alert and oriented x3, extra-ocular muscles intact, sensation grossly intact.  HEENT: Normocephalic, atraumatic  Skin: Warm and dry, no rashes. Cardiac: Regular rate and rhythm, no murmurs rubs or gallops, no lower extremity edema.  Respiratory: Clear to auscultation bilaterally. Not using accessory muscles, speaking in full sentences.   Impression and Recommendations:    HTN - she didn;t take her BP meds today. BP borderline today.    IFG - check a1C today.  Did encourage her to go for labs either today or later this week.  Tob abuse -   Encouraged cessation.   Anixety - STable. Doing well.  Gad 7 score of 4, PHQ - 9 score of 4.  Continue her current regimen with Lexapro and as needed Xanax.  Due for colonoscopy -discussed options for colon cancer screening.  She declined to do any screening right now.  Her 2 daughters are all getting married within 4 months of each other so she is feeling a little stressed and overwhelmed but says that she will consider it after that.  Discussed mammogram screening as well.  She has not been in 2 years.  Reminded her of the  importance of going for screening testing.  Encouraged her to make an appointment before she leaves the building today.

## 2019-01-27 ENCOUNTER — Other Ambulatory Visit: Payer: Self-pay | Admitting: Family Medicine

## 2019-01-27 DIAGNOSIS — E785 Hyperlipidemia, unspecified: Secondary | ICD-10-CM

## 2019-02-26 ENCOUNTER — Encounter: Payer: Self-pay | Admitting: Family Medicine

## 2019-02-26 ENCOUNTER — Other Ambulatory Visit: Payer: Self-pay | Admitting: Family Medicine

## 2019-02-26 DIAGNOSIS — Z8709 Personal history of other diseases of the respiratory system: Secondary | ICD-10-CM

## 2019-02-26 DIAGNOSIS — Z3009 Encounter for other general counseling and advice on contraception: Secondary | ICD-10-CM

## 2019-02-26 DIAGNOSIS — Z87898 Personal history of other specified conditions: Secondary | ICD-10-CM

## 2019-02-27 LAB — COMPLETE METABOLIC PANEL WITH GFR
AG Ratio: 1.4 (calc) (ref 1.0–2.5)
ALT: 31 U/L — ABNORMAL HIGH (ref 6–29)
AST: 24 U/L (ref 10–35)
Albumin: 4.2 g/dL (ref 3.6–5.1)
Alkaline phosphatase (APISO): 57 U/L (ref 37–153)
BUN: 11 mg/dL (ref 7–25)
CO2: 30 mmol/L (ref 20–32)
Calcium: 9.2 mg/dL (ref 8.6–10.4)
Chloride: 100 mmol/L (ref 98–110)
Creat: 0.64 mg/dL (ref 0.50–0.99)
GFR, Est African American: 110 mL/min/{1.73_m2} (ref >=60)
GFR, Est Non African American: 95 mL/min/{1.73_m2} (ref >=60)
Globulin: 2.9 g/dL (calc) (ref 1.9–3.7)
Glucose, Bld: 102 mg/dL — ABNORMAL HIGH (ref 65–99)
Potassium: 3.6 mmol/L (ref 3.5–5.3)
Sodium: 140 mmol/L (ref 135–146)
Total Bilirubin: 0.3 mg/dL (ref 0.2–1.2)
Total Protein: 7.1 g/dL (ref 6.1–8.1)

## 2019-02-27 LAB — LIPID PANEL
Cholesterol: 192 mg/dL (ref <200)
HDL: 44 mg/dL — ABNORMAL LOW (ref >=50)
LDL Cholesterol (Calc): 114 mg/dL (calc) — ABNORMAL HIGH
Non-HDL Cholesterol (Calc): 148 mg/dL (calc) — ABNORMAL HIGH (ref <130)
Total CHOL/HDL Ratio: 4.4 (calc) (ref <5.0)
Triglycerides: 215 mg/dL — ABNORMAL HIGH (ref <150)

## 2019-02-27 LAB — CBC
HCT: 43.3 % (ref 35.0–45.0)
Hemoglobin: 15 g/dL (ref 11.7–15.5)
MCH: 32.8 pg (ref 27.0–33.0)
MCHC: 34.6 g/dL (ref 32.0–36.0)
MCV: 94.5 fL (ref 80.0–100.0)
MPV: 10.1 fL (ref 7.5–12.5)
Platelets: 273 10*3/uL (ref 140–400)
RBC: 4.58 10*6/uL (ref 3.80–5.10)
RDW: 12.9 % (ref 11.0–15.0)
WBC: 6.2 10*3/uL (ref 3.8–10.8)

## 2019-02-27 LAB — HEMOGLOBIN A1C
Hgb A1c MFr Bld: 5.6 % of total Hgb (ref <5.7)
Mean Plasma Glucose: 114 (calc)
eAG (mmol/L): 6.3 (calc)

## 2019-02-27 NOTE — Telephone Encounter (Signed)
Test ordered.  I seriously doubt it can be added onto her blood work she will likely need to go and have it drawn.  But certainly we can call to verify.

## 2019-02-27 NOTE — Telephone Encounter (Signed)
Spoke with [facility], was advised this test requires its on tube. Will let Pt know.

## 2019-03-06 ENCOUNTER — Ambulatory Visit (INDEPENDENT_AMBULATORY_CARE_PROVIDER_SITE_OTHER): Payer: No Typology Code available for payment source

## 2019-03-06 ENCOUNTER — Other Ambulatory Visit: Payer: Self-pay

## 2019-03-06 DIAGNOSIS — Z3009 Encounter for other general counseling and advice on contraception: Secondary | ICD-10-CM

## 2019-03-06 LAB — SAR COV2 SEROLOGY (COVID19)AB(IGG),IA: SARS CoV2 AB IGG: NEGATIVE

## 2019-03-17 ENCOUNTER — Other Ambulatory Visit: Payer: Self-pay | Admitting: Family Medicine

## 2019-03-17 DIAGNOSIS — I10 Essential (primary) hypertension: Secondary | ICD-10-CM

## 2019-04-21 ENCOUNTER — Other Ambulatory Visit: Payer: Self-pay | Admitting: Family Medicine

## 2019-04-21 DIAGNOSIS — E785 Hyperlipidemia, unspecified: Secondary | ICD-10-CM

## 2019-06-04 ENCOUNTER — Other Ambulatory Visit: Payer: Self-pay | Admitting: Family Medicine

## 2019-06-04 ENCOUNTER — Telehealth: Payer: Self-pay | Admitting: Family Medicine

## 2019-06-04 DIAGNOSIS — F411 Generalized anxiety disorder: Secondary | ICD-10-CM

## 2019-06-04 NOTE — Telephone Encounter (Signed)
Please call patient overdue for 55-month follow-up was supposed to follow-up in June.  Needs to schedule to follow-up for blood pressure and mood.  We we have received refill notifications from the pharmacy.

## 2019-06-05 NOTE — Telephone Encounter (Signed)
Sent a note to [facility] for f/u appt.Maryruth Eve, Lahoma Crocker, CMA

## 2019-06-23 ENCOUNTER — Other Ambulatory Visit: Payer: Self-pay

## 2019-06-23 ENCOUNTER — Ambulatory Visit (INDEPENDENT_AMBULATORY_CARE_PROVIDER_SITE_OTHER): Payer: No Typology Code available for payment source | Admitting: Family Medicine

## 2019-06-23 ENCOUNTER — Encounter: Payer: Self-pay | Admitting: Family Medicine

## 2019-06-23 VITALS — BP 134/86 | HR 92 | Temp 98.4°F | Ht 65.0 in | Wt 167.0 lb

## 2019-06-23 DIAGNOSIS — N644 Mastodynia: Secondary | ICD-10-CM

## 2019-06-23 DIAGNOSIS — I10 Essential (primary) hypertension: Secondary | ICD-10-CM

## 2019-06-23 DIAGNOSIS — R0789 Other chest pain: Secondary | ICD-10-CM | POA: Diagnosis not present

## 2019-06-23 MED ORDER — MELOXICAM 7.5 MG PO TABS: 7.5000 mg | ORAL_TABLET | Freq: Every day | ORAL | 0 refills | Status: DC

## 2019-06-23 NOTE — Patient Instructions (Signed)
Costochondritis Costochondritis is swelling and irritation (inflammation) of the tissue (cartilage) that connects your ribs to your breastbone (sternum). This causes pain in the front of your chest. Usually, the pain:  Starts gradually.  Is in more than one rib. This condition usually goes away on its own over time. Follow these instructions at home:  Do not do anything that makes your pain worse.  If directed, put ice on the painful area: ? Put ice in a plastic bag. ? Place a towel between your skin and the bag. ? Leave the ice on for 20 minutes, 2-3 times a day.  If directed, put heat on the affected area as often as told by your doctor. Use the heat source that your doctor tells you to use, such as a moist heat pack or a heating pad. ? Place a towel between your skin and the heat source. ? Leave the heat on for 20-30 minutes. ? Take off the heat if your skin turns bright red. This is very important if you cannot feel pain, heat, or cold. You may have a greater risk of getting burned.  Take over-the-counter and prescription medicines only as told by your doctor.  Return to your normal activities as told by your doctor. Ask your doctor what activities are safe for you.  Keep all follow-up visits as told by your doctor. This is important. Contact a doctor if:  You have chills or a fever.  Your pain does not go away or it gets worse.  You have a cough that does not go away. Get help right away if:  You are short of breath. This information is not intended to replace advice given to you by your health care provider. Make sure you discuss any questions you have with your health care provider. Document Released: 03/13/2008 Document Revised: 10/10/2017 Document Reviewed: 01/19/2016 Elsevier Patient Education  2020 Elsevier Inc.  
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
## 2019-07-01 ENCOUNTER — Ambulatory Visit: Payer: No Typology Code available for payment source | Admitting: Family Medicine

## 2019-07-03 ENCOUNTER — Ambulatory Visit: Payer: No Typology Code available for payment source

## 2019-07-03 ENCOUNTER — Other Ambulatory Visit: Payer: Self-pay

## 2019-07-03 ENCOUNTER — Ambulatory Visit
Admission: RE | Admit: 2019-07-03 | Discharge: 2019-07-03 | Disposition: A | Discharge status: Home / Self Care | Payer: No Typology Code available for payment source | Source: Ambulatory Visit | Attending: Family Medicine | Admitting: Family Medicine

## 2019-07-03 DIAGNOSIS — N644 Mastodynia: Secondary | ICD-10-CM

## 2019-07-03 DIAGNOSIS — R0789 Other chest pain: Secondary | ICD-10-CM

## 2019-07-14 ENCOUNTER — Encounter: Payer: Self-pay | Admitting: Family Medicine

## 2019-07-14 NOTE — Addendum Note (Signed)
Addended by: Alena Bills R on: 07/14/2019 03:44 PM   Modules accepted: Orders

## 2019-08-28 ENCOUNTER — Other Ambulatory Visit: Payer: Self-pay | Admitting: Family Medicine

## 2019-08-28 DIAGNOSIS — I10 Essential (primary) hypertension: Secondary | ICD-10-CM

## 2019-09-23 ENCOUNTER — Ambulatory Visit (INDEPENDENT_AMBULATORY_CARE_PROVIDER_SITE_OTHER): Payer: No Typology Code available for payment source | Admitting: Family Medicine

## 2019-09-23 ENCOUNTER — Encounter: Payer: Self-pay | Admitting: Family Medicine

## 2019-09-23 ENCOUNTER — Other Ambulatory Visit: Payer: Self-pay

## 2019-09-23 VITALS — BP 128/65 | HR 78 | Ht 65.0 in | Wt 169.0 lb

## 2019-09-23 DIAGNOSIS — F411 Generalized anxiety disorder: Secondary | ICD-10-CM | POA: Diagnosis not present

## 2019-09-23 DIAGNOSIS — E8881 Metabolic syndrome: Secondary | ICD-10-CM

## 2019-09-23 DIAGNOSIS — I10 Essential (primary) hypertension: Secondary | ICD-10-CM

## 2019-09-23 LAB — POCT GLYCOSYLATED HEMOGLOBIN (HGB A1C): Hemoglobin A1C: 5.8 % — AB (ref 4.0–5.6)

## 2019-09-23 MED ORDER — ESCITALOPRAM OXALATE 20 MG PO TABS: 20.0000 mg | ORAL_TABLET | Freq: Every day | ORAL | 3 refills | Status: DC

## 2019-09-23 MED ORDER — LOSARTAN POTASSIUM-HCTZ 50-12.5 MG PO TABS: 1.0000 | ORAL_TABLET | Freq: Every day | ORAL | 1 refills | Status: DC
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
## 2019-09-23 NOTE — Assessment & Plan Note (Signed)
Continue current regimen. F/U in 6 mo.

## 2019-09-23 NOTE — Assessment & Plan Note (Signed)
A1C went up slightly. Recheck in 59months. Discussed diet and exercise.

## 2019-09-23 NOTE — Assessment & Plan Note (Signed)
Well controlled. Continue current regimen. Follow up in  6 mo  

## 2019-09-24 MED ORDER — ALPRAZOLAM 0.5 MG PO TABS: ORAL_TABLET | ORAL | 0 refills | Status: DC

## 2019-09-30 LAB — BASIC METABOLIC PANEL WITH GFR
BUN: 12 mg/dL (ref 7–25)
CO2: 29 mmol/L (ref 20–32)
Calcium: 9.3 mg/dL (ref 8.6–10.4)
Chloride: 100 mmol/L (ref 98–110)
Creat: 0.69 mg/dL (ref 0.50–0.99)
GFR, Est African American: 107 mL/min/{1.73_m2} (ref >=60)
GFR, Est Non African American: 92 mL/min/{1.73_m2} (ref >=60)
Glucose, Bld: 98 mg/dL (ref 65–139)
Potassium: 3.7 mmol/L (ref 3.5–5.3)
Sodium: 138 mmol/L (ref 135–146)

## 2019-09-30 NOTE — Progress Notes (Signed)
All labs are normal. 

## 2019-11-18 ENCOUNTER — Ambulatory Visit: Payer: No Typology Code available for payment source | Attending: Internal Medicine

## 2019-11-18 DIAGNOSIS — Z23 Encounter for immunization: Secondary | ICD-10-CM

## 2019-11-18 NOTE — Progress Notes (Signed)
   Covid-19 Vaccination Clinic  Name:  Valerie Carroll    MRN: [id number] DOB: 11/12/1955  11/18/2019  Valerie Carroll was observed post Covid-19 immunization for 15 minutes without incidence. She was provided with Vaccine Information Sheet and instruction to access the V-Safe system.   Valerie Carroll was instructed to call 911 with any severe reactions post vaccine: Marland Kitchen Difficulty breathing  . Swelling of your face and throat  . A fast heartbeat  . A bad rash all over your body  . Dizziness and weakness    Immunizations Administered    Name Date Dose VIS Date Route   Pfizer COVID-19 Vaccine 11/18/2019 12:04 PM 0.3 mL 09/19/2019 Intramuscular   Manufacturer: [place]   Lot: [id number]   [place]: [id number]

## 2019-11-20 ENCOUNTER — Ambulatory Visit: Payer: No Typology Code available for payment source

## 2019-12-13 ENCOUNTER — Ambulatory Visit: Payer: No Typology Code available for payment source | Attending: Internal Medicine

## 2019-12-13 DIAGNOSIS — Z23 Encounter for immunization: Secondary | ICD-10-CM

## 2019-12-13 NOTE — Progress Notes (Signed)
   Covid-19 Vaccination Clinic  Name:  Valerie Carroll    MRN: [id number] DOB: 12/25/1955  12/13/2019  [name] was observed post Covid-19 immunization for 15 minutes without incident. She was provided with Vaccine Information Sheet and instruction to access the V-Safe system.   [name] was instructed to call 911 with any severe reactions post vaccine: Marland Kitchen Difficulty breathing  . Swelling of face and throat  . A fast heartbeat  . A bad rash all over body  . Dizziness and weakness   Immunizations Administered    Name Date Dose VIS Date Route   Pfizer COVID-19 Vaccine 12/13/2019  8:09 AM 0.3 mL 09/19/2019 Intramuscular   Manufacturer: [place]   Lot: [id number]   [place]: [id number]

## 2020-03-10 ENCOUNTER — Other Ambulatory Visit: Payer: Self-pay | Admitting: Family Medicine

## 2020-03-10 DIAGNOSIS — F411 Generalized anxiety disorder: Secondary | ICD-10-CM

## 2020-03-10 NOTE — Telephone Encounter (Signed)
Please call patient: Did send over refill request but she is due for a follow-up appointment this month for A1c, blood pressure check etc.  Please get her scheduled for sometime later this month.

## 2020-03-11 NOTE — Telephone Encounter (Signed)
Patient has been scheduled

## 2020-03-31 ENCOUNTER — Encounter: Payer: Self-pay | Admitting: Family Medicine

## 2020-03-31 ENCOUNTER — Other Ambulatory Visit: Payer: Self-pay | Admitting: Family Medicine

## 2020-03-31 ENCOUNTER — Ambulatory Visit (INDEPENDENT_AMBULATORY_CARE_PROVIDER_SITE_OTHER): Payer: Medicare Other | Admitting: Family Medicine

## 2020-03-31 VITALS — BP 135/41 | HR 75 | Ht 65.0 in | Wt 166.0 lb

## 2020-03-31 DIAGNOSIS — F411 Generalized anxiety disorder: Secondary | ICD-10-CM

## 2020-03-31 DIAGNOSIS — I10 Essential (primary) hypertension: Secondary | ICD-10-CM

## 2020-03-31 DIAGNOSIS — Z1231 Encounter for screening mammogram for malignant neoplasm of breast: Secondary | ICD-10-CM | POA: Diagnosis not present

## 2020-03-31 DIAGNOSIS — R7301 Impaired fasting glucose: Secondary | ICD-10-CM

## 2020-03-31 DIAGNOSIS — E8881 Metabolic syndrome: Secondary | ICD-10-CM

## 2020-03-31 DIAGNOSIS — Z1211 Encounter for screening for malignant neoplasm of colon: Secondary | ICD-10-CM

## 2020-03-31 DIAGNOSIS — E785 Hyperlipidemia, unspecified: Secondary | ICD-10-CM

## 2020-03-31 LAB — COMPLETE METABOLIC PANEL WITH GFR
AG Ratio: 1.7 (calc) (ref 1.0–2.5)
ALT: 25 U/L (ref 6–29)
AST: 19 U/L (ref 10–35)
Albumin: 4.2 g/dL (ref 3.6–5.1)
Alkaline phosphatase (APISO): 55 U/L (ref 37–153)
BUN: 13 mg/dL (ref 7–25)
CO2: 30 mmol/L (ref 20–32)
Calcium: 8.8 mg/dL (ref 8.6–10.4)
Chloride: 100 mmol/L (ref 98–110)
Creat: 0.63 mg/dL (ref 0.50–0.99)
GFR, Est African American: 109 mL/min/{1.73_m2} (ref >=60)
GFR, Est Non African American: 94 mL/min/{1.73_m2} (ref >=60)
Globulin: 2.5 g/dL (calc) (ref 1.9–3.7)
Glucose, Bld: 108 mg/dL — ABNORMAL HIGH (ref 65–99)
Potassium: 3.9 mmol/L (ref 3.5–5.3)
Sodium: 137 mmol/L (ref 135–146)
Total Bilirubin: 0.4 mg/dL (ref 0.2–1.2)
Total Protein: 6.7 g/dL (ref 6.1–8.1)

## 2020-03-31 LAB — LIPID PANEL
Cholesterol: 181 mg/dL (ref <200)
HDL: 52 mg/dL (ref >=50)
LDL Cholesterol (Calc): 99 mg/dL (calc)
Non-HDL Cholesterol (Calc): 129 mg/dL (calc) (ref <130)
Total CHOL/HDL Ratio: 3.5 (calc) (ref <5.0)
Triglycerides: 210 mg/dL — ABNORMAL HIGH (ref <150)

## 2020-03-31 LAB — CBC
HCT: 42.5 % (ref 35.0–45.0)
Hemoglobin: 14.5 g/dL (ref 11.7–15.5)
MCH: 32.8 pg (ref 27.0–33.0)
MCHC: 34.1 g/dL (ref 32.0–36.0)
MCV: 96.2 fL (ref 80.0–100.0)
MPV: 9.9 fL (ref 7.5–12.5)
Platelets: 270 10*3/uL (ref 140–400)
RBC: 4.42 10*6/uL (ref 3.80–5.10)
RDW: 12.7 % (ref 11.0–15.0)
WBC: 6.3 10*3/uL (ref 3.8–10.8)

## 2020-03-31 MED ORDER — LOSARTAN POTASSIUM-HCTZ 50-12.5 MG PO TABS: 1.0000 | ORAL_TABLET | Freq: Every day | ORAL | 1 refills | Status: DC

## 2020-03-31 NOTE — Assessment & Plan Note (Signed)
Due to recheck A1C  
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
## 2020-03-31 NOTE — Assessment & Plan Note (Signed)
Continue with lexapro for now.  Dealing with some acute stressors. Can address further if feels her sxs are worsening.

## 2020-03-31 NOTE — Assessment & Plan Note (Signed)
Well controlled. Continue current regimen. Follow up in  6 mo. Due for labs.  

## 2020-04-01 LAB — HEMOGLOBIN A1C
Hgb A1c MFr Bld: 5.7 % of total Hgb — ABNORMAL HIGH (ref <5.7)
Mean Plasma Glucose: 117 (calc)
eAG (mmol/L): 6.5 (calc)

## 2020-04-02 ENCOUNTER — Other Ambulatory Visit: Payer: Self-pay | Admitting: Family Medicine

## 2020-04-02 DIAGNOSIS — E785 Hyperlipidemia, unspecified: Secondary | ICD-10-CM

## 2020-04-05 ENCOUNTER — Other Ambulatory Visit: Payer: Self-pay | Admitting: Family Medicine

## 2020-04-05 DIAGNOSIS — E785 Hyperlipidemia, unspecified: Secondary | ICD-10-CM

## 2020-04-08 ENCOUNTER — Ambulatory Visit (INDEPENDENT_AMBULATORY_CARE_PROVIDER_SITE_OTHER): Payer: Medicare Other

## 2020-04-08 ENCOUNTER — Other Ambulatory Visit: Payer: Self-pay

## 2020-04-08 DIAGNOSIS — Z1231 Encounter for screening mammogram for malignant neoplasm of breast: Secondary | ICD-10-CM | POA: Diagnosis not present

## 2020-04-14 LAB — COLOGUARD: Cologuard: NEGATIVE

## 2020-04-19 LAB — COLOGUARD
COLOGUARD: NEGATIVE
Cologuard: NEGATIVE

## 2020-04-21 ENCOUNTER — Encounter: Payer: Self-pay | Admitting: Family Medicine

## 2020-04-21 ENCOUNTER — Telehealth: Payer: Self-pay | Admitting: Family Medicine

## 2020-04-21 NOTE — Telephone Encounter (Signed)
Call pt: Cologuard is negative. Repeat colon cancer screening in 3 years.

## 2020-04-22 NOTE — Telephone Encounter (Signed)
Task completed. Pt has been updated of Cologuard results. No inquiries during the call.

## 2020-05-03 ENCOUNTER — Other Ambulatory Visit: Payer: Self-pay | Admitting: Family Medicine

## 2020-05-03 DIAGNOSIS — E785 Hyperlipidemia, unspecified: Secondary | ICD-10-CM

## 2020-07-19 ENCOUNTER — Other Ambulatory Visit: Payer: Self-pay | Admitting: Family Medicine

## 2020-07-19 DIAGNOSIS — F411 Generalized anxiety disorder: Secondary | ICD-10-CM

## 2020-10-05 ENCOUNTER — Other Ambulatory Visit: Payer: Self-pay | Admitting: Family Medicine

## 2020-10-05 DIAGNOSIS — F411 Generalized anxiety disorder: Secondary | ICD-10-CM

## 2020-10-05 DIAGNOSIS — I10 Essential (primary) hypertension: Secondary | ICD-10-CM

## 2020-11-09 ENCOUNTER — Ambulatory Visit (INDEPENDENT_AMBULATORY_CARE_PROVIDER_SITE_OTHER): Payer: Medicare Other | Admitting: Family Medicine

## 2020-11-09 ENCOUNTER — Other Ambulatory Visit: Payer: Self-pay

## 2020-11-09 ENCOUNTER — Other Ambulatory Visit (HOSPITAL_COMMUNITY)
Admission: RE | Admit: 2020-11-09 | Discharge: 2020-11-09 | Disposition: A | Discharge status: Home / Self Care | Payer: Medicare Other | Source: Ambulatory Visit | Attending: Family Medicine | Admitting: Family Medicine

## 2020-11-09 ENCOUNTER — Encounter: Payer: Self-pay | Admitting: Family Medicine

## 2020-11-09 VITALS — BP 104/62 | HR 85 | Ht 65.0 in | Wt 168.0 lb

## 2020-11-09 DIAGNOSIS — Z0000 Encounter for general adult medical examination without abnormal findings: Secondary | ICD-10-CM

## 2020-11-09 DIAGNOSIS — Z124 Encounter for screening for malignant neoplasm of cervix: Secondary | ICD-10-CM | POA: Diagnosis present

## 2020-11-09 DIAGNOSIS — Z1151 Encounter for screening for human papillomavirus (HPV): Secondary | ICD-10-CM | POA: Diagnosis not present

## 2020-11-09 DIAGNOSIS — F411 Generalized anxiety disorder: Secondary | ICD-10-CM

## 2020-11-09 DIAGNOSIS — E8881 Metabolic syndrome: Secondary | ICD-10-CM | POA: Diagnosis not present

## 2020-11-09 DIAGNOSIS — I10 Essential (primary) hypertension: Secondary | ICD-10-CM

## 2020-11-09 DIAGNOSIS — Z9889 Other specified postprocedural states: Secondary | ICD-10-CM

## 2020-11-09 HISTORY — DX: Personal history of malignant melanoma of skin: Z98.890

## 2020-11-09 MED ORDER — ZOSTER VAC RECOMB ADJUVANTED 50 MCG/0.5ML IM SUSR: 0.5000 mL | Freq: Once | INTRAMUSCULAR | 0 refills | Status: AC

## 2020-11-09 NOTE — Patient Instructions (Signed)
  Valerie Carroll , Thank you for taking time to come for your Medicare Wellness Visit. I appreciate your ongoing commitment to your health goals. Please review the following plan we discussed and let me know if I can assist you in the future.   These are the goals we discussed: Goals    . Exercise 3x per week (30 min per time)     Encourage you to start some exercise for 3 days per week.        This is a list of the screening recommended for you and due dates:  Health Maintenance  Topic Date Due  . Pap Smear  11/14/2019  . DEXA scan (bone density measurement)  03/18/2020  . Pneumonia vaccines (1 of 2 - PCV13) Never done  . Mammogram  04/08/2021  . Tetanus Vaccine  07/22/2022  . Cologuard (Stool DNA test)  04/15/2023  . Flu Shot  Completed  . COVID-19 Vaccine  Completed  .  Hepatitis C: One time screening is recommended by Center for Disease Control  (CDC) for  adults born from [birth year] through 1965.   Completed  . HIV Screening  Completed
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
## 2020-11-10 ENCOUNTER — Telehealth: Payer: Self-pay | Admitting: Family Medicine

## 2020-11-10 DIAGNOSIS — C44321 Squamous cell carcinoma of skin of nose: Secondary | ICD-10-CM | POA: Insufficient documentation

## 2020-11-10 DIAGNOSIS — R9431 Abnormal electrocardiogram [ECG] [EKG]: Secondary | ICD-10-CM

## 2020-11-10 LAB — COMPLETE METABOLIC PANEL WITH GFR
AG Ratio: 1.4 (calc) (ref 1.0–2.5)
ALT: 32 U/L — ABNORMAL HIGH (ref 6–29)
AST: 23 U/L (ref 10–35)
Albumin: 4.2 g/dL (ref 3.6–5.1)
Alkaline phosphatase (APISO): 60 U/L (ref 37–153)
BUN: 11 mg/dL (ref 7–25)
CO2: 29 mmol/L (ref 20–32)
Calcium: 9 mg/dL (ref 8.6–10.4)
Chloride: 102 mmol/L (ref 98–110)
Creat: 0.71 mg/dL (ref 0.50–0.99)
GFR, Est African American: 104 mL/min/{1.73_m2} (ref >=60)
GFR, Est Non African American: 89 mL/min/{1.73_m2} (ref >=60)
Globulin: 2.9 g/dL (calc) (ref 1.9–3.7)
Glucose, Bld: 106 mg/dL — ABNORMAL HIGH (ref 65–99)
Potassium: 4 mmol/L (ref 3.5–5.3)
Sodium: 138 mmol/L (ref 135–146)
Total Bilirubin: 0.3 mg/dL (ref 0.2–1.2)
Total Protein: 7.1 g/dL (ref 6.1–8.1)

## 2020-11-10 LAB — CBC
HCT: 43.3 % (ref 35.0–45.0)
Hemoglobin: 15 g/dL (ref 11.7–15.5)
MCH: 32.6 pg (ref 27.0–33.0)
MCHC: 34.6 g/dL (ref 32.0–36.0)
MCV: 94.1 fL (ref 80.0–100.0)
MPV: 10.5 fL (ref 7.5–12.5)
Platelets: 271 10*3/uL (ref 140–400)
RBC: 4.6 10*6/uL (ref 3.80–5.10)
RDW: 12.2 % (ref 11.0–15.0)
WBC: 6.9 10*3/uL (ref 3.8–10.8)

## 2020-11-10 LAB — HEMOGLOBIN A1C
Hgb A1c MFr Bld: 6 % of total Hgb — ABNORMAL HIGH (ref <5.7)
Mean Plasma Glucose: 126 mg/dL
eAG (mmol/L): 7 mmol/L

## 2020-11-10 MED ORDER — LOSARTAN POTASSIUM-HCTZ 50-12.5 MG PO TABS: 1.0000 | ORAL_TABLET | Freq: Every day | ORAL | 1 refills | Status: DC

## 2020-11-10 MED ORDER — ALPRAZOLAM 0.5 MG PO TABS: ORAL_TABLET | ORAL | 0 refills | Status: DC

## 2020-11-10 NOTE — Telephone Encounter (Signed)
Please call pt: I compared her EKG to her old onw from 2020 and it doesn't look like there may be a slight change. I would like to refer her to cards if she is OK with that

## 2020-11-11 LAB — CYTOLOGY - PAP
Comment: NEGATIVE
Diagnosis: NEGATIVE
High risk HPV: NEGATIVE

## 2020-11-11 NOTE — Progress Notes (Signed)
Call patient: Your Pap smear is normal.

## 2020-11-18 NOTE — Telephone Encounter (Signed)
Patient made aware and is okay with the referral. This has been placed to Cardiology.
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
## 2021-01-07 HISTORY — PX: MOHS SURGERY: SUR867

## 2021-01-12 ENCOUNTER — Other Ambulatory Visit: Payer: Self-pay

## 2021-01-12 ENCOUNTER — Ambulatory Visit (INDEPENDENT_AMBULATORY_CARE_PROVIDER_SITE_OTHER): Payer: Medicare Other | Admitting: Cardiology

## 2021-01-12 ENCOUNTER — Encounter: Payer: Self-pay | Admitting: Cardiology

## 2021-01-12 VITALS — BP 140/78 | HR 86 | Ht 65.0 in | Wt 168.4 lb

## 2021-01-12 DIAGNOSIS — R9431 Abnormal electrocardiogram [ECG] [EKG]: Secondary | ICD-10-CM

## 2021-01-12 DIAGNOSIS — R072 Precordial pain: Secondary | ICD-10-CM | POA: Diagnosis not present

## 2021-01-12 DIAGNOSIS — R0600 Dyspnea, unspecified: Secondary | ICD-10-CM | POA: Diagnosis not present

## 2021-01-12 DIAGNOSIS — I10 Essential (primary) hypertension: Secondary | ICD-10-CM

## 2021-01-12 DIAGNOSIS — R0609 Other forms of dyspnea: Secondary | ICD-10-CM

## 2021-01-12 DIAGNOSIS — E7800 Pure hypercholesterolemia, unspecified: Secondary | ICD-10-CM | POA: Diagnosis not present

## 2021-01-12 MED ORDER — METOPROLOL TARTRATE 100 MG PO TABS: ORAL_TABLET | ORAL | 0 refills | Status: DC

## 2021-01-12 NOTE — Patient Instructions (Signed)
Testing/Procedures:  Your cardiac CT will be scheduled at one of the below locations:   [facility] [address] [place], [place] [address] [phone]   If scheduled at [facility], please arrive at the [facility] main entrance (entrance A) of [facility] 30 minutes prior to test start time. Proceed to the [facility] Radiology Department (first floor) to check-in and test prep.  Please follow these instructions carefully (unless otherwise directed):  On the Night Before the Test: . Be sure to Drink plenty of water. . Do not consume any caffeinated/decaffeinated beverages or chocolate 12 hours prior to your test. . Do not take any antihistamines 12 hours prior to your test.  On the Day of the Test: . Drink plenty of water until 1 hour prior to the test. . Do not eat any food 4 hours prior to the test. . You may take your regular medications prior to the test.  . Take metoprolol (Lopressor) 100 mg two hours prior to test. . HOLD Furosemide/Hydrochlorothiazide morning of the test. . FEMALES- please wear underwire-free bra if available       After the Test: . Drink plenty of water. . After receiving IV contrast, you may experience a mild flushed feeling. This is normal. . On occasion, you may experience a mild rash up to 24 hours after the test. This is not dangerous. If this occurs, you can take Benadryl 25 mg and increase your fluid intake. . If you experience trouble breathing, this can be serious. If it is severe call 911 IMMEDIATELY. If it is mild, please call our office. . If you take any of these medications: Glipizide/Metformin, Avandament, Glucavance, please do not take 48 hours after completing test unless otherwise instructed.   Once we have confirmed authorization from your insurance company, we will call you to set up a date and time for your test. Based on how quickly your insurance processes prior authorizations requests, please  allow up to 4 weeks to be contacted for scheduling your Cardiac CT appointment. Be advised that routine Cardiac CT appointments could be scheduled as many as 8 weeks after your provider has ordered it.  For non-scheduling related questions, please contact the cardiac imaging nurse navigator should you have any questions/concerns: Marchia Bond, Cardiac Imaging Nurse Navigator Gordy Clement, Cardiac Imaging Nurse Navigator [facility] Direct Office Dial: [phone]   For scheduling needs, including cancellations and rescheduling, please call [place], [phone].     Follow-Up: At [facility], you and your health needs are our priority.  As part of our continuing mission to provide you with exceptional heart care, we have created designated Provider Care Teams.  These Care Teams include your primary Cardiologist (physician) and Advanced Practice Providers (APPs -  Physician Assistants and Nurse Practitioners) who all work together to provide you with the care you need, when you need it.  We recommend signing up for the patient portal called "MyChart".  Sign up information is provided on this After Visit Summary.  MyChart is used to connect with patients for Virtual Visits (Telemedicine).  Patients are able to view lab/test results, encounter notes, upcoming appointments, etc.  Non-urgent messages can be sent to your provider as well.   To learn more about what you can do with MyChart, go to [email or web address].    Your next appointment:   6 month(s)  The format for your next appointment:   In Person  Provider:   Kirk Ruths,  MD

## 2021-01-14 ENCOUNTER — Other Ambulatory Visit: Payer: Self-pay | Admitting: *Deleted

## 2021-01-14 DIAGNOSIS — R072 Precordial pain: Secondary | ICD-10-CM

## 2021-01-14 NOTE — Progress Notes (Signed)
bmp 

## 2021-01-18 ENCOUNTER — Telehealth: Payer: Self-pay | Admitting: Cardiology

## 2021-01-18 DIAGNOSIS — R072 Precordial pain: Secondary | ICD-10-CM

## 2021-01-18 DIAGNOSIS — Z01812 Encounter for preprocedural laboratory examination: Secondary | ICD-10-CM

## 2021-01-18 DIAGNOSIS — I10 Essential (primary) hypertension: Secondary | ICD-10-CM

## 2021-01-18 NOTE — Telephone Encounter (Signed)
Spoke to patient she stated she wants to have bmet done at [facility] in [place].Advised I will place order.

## 2021-01-18 NOTE — Telephone Encounter (Signed)
Pt called and asked could we fax the lab order to  [facility] in [place] because it's closer to her home.  [address] [facility] [phone]

## 2021-01-20 ENCOUNTER — Other Ambulatory Visit: Payer: Self-pay | Admitting: Family Medicine

## 2021-01-20 ENCOUNTER — Telehealth (HOSPITAL_COMMUNITY): Payer: Self-pay | Admitting: Emergency Medicine

## 2021-01-20 DIAGNOSIS — F411 Generalized anxiety disorder: Secondary | ICD-10-CM

## 2021-01-20 DIAGNOSIS — E785 Hyperlipidemia, unspecified: Secondary | ICD-10-CM

## 2021-01-20 LAB — BASIC METABOLIC PANEL
BUN/Creatinine Ratio: 16 (ref 12–28)
BUN: 11 mg/dL (ref 8–27)
CO2: 25 mmol/L (ref 20–29)
Calcium: 9.2 mg/dL (ref 8.7–10.3)
Chloride: 97 mmol/L (ref 96–106)
Creatinine, Ser: 0.67 mg/dL (ref 0.57–1.00)
Glucose: 93 mg/dL (ref 65–99)
Potassium: 3.9 mmol/L (ref 3.5–5.2)
Sodium: 138 mmol/L (ref 134–144)
eGFR: 97 mL/min/{1.73_m2} (ref >59)

## 2021-01-20 NOTE — Telephone Encounter (Signed)
Reaching out to patient to offer assistance regarding upcoming cardiac imaging study; pt verbalizes understanding of appt date/time, parking situation and where to check in, pre-test NPO status and medications ordered, and verified current allergies; name and call back number provided for further questions should they arise Marchia Bond RN Navigator Cardiac Imaging [facility] [phone] office [phone] cell  Hold losartan-hctz, taking 100mg  metoprolol tart 2 hr prior to scan

## 2021-01-21 ENCOUNTER — Other Ambulatory Visit: Payer: Self-pay

## 2021-01-21 ENCOUNTER — Ambulatory Visit (HOSPITAL_COMMUNITY)
Admission: RE | Admit: 2021-01-21 | Discharge: 2021-01-21 | Disposition: A | Discharge status: Home / Self Care | Payer: Medicare Other | Source: Ambulatory Visit | Attending: Cardiology | Admitting: Cardiology

## 2021-01-21 ENCOUNTER — Encounter (HOSPITAL_COMMUNITY): Payer: Self-pay

## 2021-01-21 DIAGNOSIS — R072 Precordial pain: Secondary | ICD-10-CM | POA: Diagnosis not present

## 2021-01-21 MED ORDER — NITROGLYCERIN 0.4 MG SL SUBL
0.8000 mg | SUBLINGUAL_TABLET | Freq: Once | SUBLINGUAL | Status: AC
  Administered 2021-01-21: 0.8 mg via SUBLINGUAL

## 2021-01-21 MED ORDER — NITROGLYCERIN 0.4 MG SL SUBL
SUBLINGUAL_TABLET | SUBLINGUAL | Status: AC
  Filled 2021-01-21: qty 2

## 2021-01-21 MED ORDER — IOHEXOL 350 MG/ML SOLN
100.0000 mL | Freq: Once | INTRAVENOUS | Status: AC | PRN
  Administered 2021-01-21: 95 mL via INTRAVENOUS

## 2021-01-21 NOTE — Progress Notes (Signed)
Discharged walking to drive herself home. Feels fine, VS stable.

## 2021-03-01 ENCOUNTER — Ambulatory Visit (INDEPENDENT_AMBULATORY_CARE_PROVIDER_SITE_OTHER): Payer: Medicare Other | Admitting: Sports Medicine

## 2021-03-01 ENCOUNTER — Other Ambulatory Visit: Payer: Self-pay

## 2021-03-01 ENCOUNTER — Ambulatory Visit (INDEPENDENT_AMBULATORY_CARE_PROVIDER_SITE_OTHER): Payer: Medicare Other

## 2021-03-01 DIAGNOSIS — M7551 Bursitis of right shoulder: Secondary | ICD-10-CM

## 2021-03-01 DIAGNOSIS — M19011 Primary osteoarthritis, right shoulder: Secondary | ICD-10-CM | POA: Insufficient documentation

## 2021-03-01 DIAGNOSIS — M7541 Impingement syndrome of right shoulder: Secondary | ICD-10-CM | POA: Insufficient documentation

## 2021-03-01 NOTE — Progress Notes (Signed)
    Procedures performed today:    Procedure: Real-time Ultrasound Guided injection of the right subacromial bursa Device: Samsung HS60  Verbal informed consent obtained.  Time-out conducted.  Noted no overlying erythema, induration, or other signs of local infection.  Skin prepped in a sterile fashion.  Local anesthesia: Topical Ethyl chloride.  With sterile technique and under real time ultrasound guidance:  Noted intact rotator cuff, 1 cc Kenalog 40, 1 cc lidocaine, 1 cc bupivacaine injected easily Completed without difficulty  Advised to call if fevers/chills, erythema, induration, drainage, or persistent bleeding.  Images permanently stored and available for review in PACS.  Impression: Technically successful ultrasound guided injection.  Independent interpretation of notes and tests performed by another provider:   None.  Brief History, Exam, Impression, and Recommendations:    Acute shoulder bursitis, right This is a pleasant 65 year old female, she has a long history of shoulder pain for decades, more recently she was pruning her flowers, she spent all day, the next day she had severe pain over the deltoid, worse with abduction and overhead activities. On exam she has a positive Neer's, Hawkins, empty can signs. Good passive motion however. She has tried ibuprofen 800 mg 3-4 times a day without any improvement. Today we injected her subacromial bursa, adding x-rays, formal PT. Return to see me in 6 weeks, MRI if no better.    ___________________________________________ Gwen Her. Dianah Field, M.D., ABFM., CAQSM. Primary Care and [place] Instructor of [place] [facility]

## 2021-03-01 NOTE — Assessment & Plan Note (Signed)
This is a pleasant 65 year old female, she has a long history of shoulder pain for decades, more recently she was pruning her flowers, she spent all day, the next day she had severe pain over the deltoid, worse with abduction and overhead activities. On exam she has a positive Neer's, Hawkins, empty can signs. Good passive motion however. She has tried ibuprofen 800 mg 3-4 times a day without any improvement. Today we injected her subacromial bursa, adding x-rays, formal PT. Return to see me in 6 weeks, MRI if no better.

## 2021-03-03 ENCOUNTER — Other Ambulatory Visit: Payer: Self-pay

## 2021-03-03 ENCOUNTER — Encounter: Payer: Self-pay | Admitting: Physical Therapy

## 2021-03-03 ENCOUNTER — Ambulatory Visit (INDEPENDENT_AMBULATORY_CARE_PROVIDER_SITE_OTHER): Payer: Medicare Other | Admitting: Physical Therapy

## 2021-03-03 DIAGNOSIS — R293 Abnormal posture: Secondary | ICD-10-CM

## 2021-03-03 DIAGNOSIS — M25511 Pain in right shoulder: Secondary | ICD-10-CM | POA: Diagnosis not present

## 2021-03-03 DIAGNOSIS — M6281 Muscle weakness (generalized): Secondary | ICD-10-CM | POA: Diagnosis not present

## 2021-03-03 NOTE — Patient Instructions (Signed)
Access Code: [email or web address] URL: [email or web address] Date: 03/03/2021 Prepared by: Isabelle Course  Exercises Supine Shoulder Horizontal Abduction with Resistance - 1 x daily - 7 x weekly - 3 sets - 10 reps Supine Scapular Protraction in Flexion with Dumbbells - 1 x daily - 7 x weekly - 3 sets - 10 reps Standing Bilateral Low Shoulder Row with Anchored Resistance - 1 x daily - 7 x weekly - 3 sets - 10 reps Shoulder extension with resistance - Neutral - 1 x daily - 7 x weekly - 3 sets - 10 reps
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
## 2021-03-09 ENCOUNTER — Other Ambulatory Visit: Payer: Self-pay

## 2021-03-09 ENCOUNTER — Ambulatory Visit (INDEPENDENT_AMBULATORY_CARE_PROVIDER_SITE_OTHER): Payer: Medicare Other | Admitting: Physical Therapy

## 2021-03-09 DIAGNOSIS — R293 Abnormal posture: Secondary | ICD-10-CM

## 2021-03-09 DIAGNOSIS — M6281 Muscle weakness (generalized): Secondary | ICD-10-CM | POA: Diagnosis not present

## 2021-03-09 DIAGNOSIS — M25511 Pain in right shoulder: Secondary | ICD-10-CM | POA: Diagnosis not present
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
## 2021-03-11 ENCOUNTER — Ambulatory Visit (INDEPENDENT_AMBULATORY_CARE_PROVIDER_SITE_OTHER): Payer: Medicare Other | Admitting: Physical Therapy

## 2021-03-11 ENCOUNTER — Other Ambulatory Visit: Payer: Self-pay

## 2021-03-11 DIAGNOSIS — M25511 Pain in right shoulder: Secondary | ICD-10-CM | POA: Diagnosis present

## 2021-03-11 DIAGNOSIS — R293 Abnormal posture: Secondary | ICD-10-CM | POA: Diagnosis not present

## 2021-03-11 DIAGNOSIS — M6281 Muscle weakness (generalized): Secondary | ICD-10-CM | POA: Diagnosis not present

## 2021-03-11 NOTE — Patient Instructions (Signed)
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
## 2021-03-17 ENCOUNTER — Other Ambulatory Visit: Payer: Self-pay

## 2021-03-17 ENCOUNTER — Ambulatory Visit (INDEPENDENT_AMBULATORY_CARE_PROVIDER_SITE_OTHER): Payer: Medicare Other | Admitting: Physical Therapy

## 2021-03-17 DIAGNOSIS — M25511 Pain in right shoulder: Secondary | ICD-10-CM | POA: Diagnosis not present

## 2021-03-17 DIAGNOSIS — M6281 Muscle weakness (generalized): Secondary | ICD-10-CM | POA: Diagnosis not present

## 2021-03-17 DIAGNOSIS — R293 Abnormal posture: Secondary | ICD-10-CM
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
## 2021-03-22 ENCOUNTER — Other Ambulatory Visit: Payer: Self-pay

## 2021-03-22 ENCOUNTER — Ambulatory Visit (INDEPENDENT_AMBULATORY_CARE_PROVIDER_SITE_OTHER): Payer: Medicare Other | Admitting: Physical Therapy

## 2021-03-22 ENCOUNTER — Encounter: Payer: Self-pay | Admitting: Physical Therapy

## 2021-03-22 DIAGNOSIS — M25511 Pain in right shoulder: Secondary | ICD-10-CM | POA: Diagnosis present

## 2021-03-22 DIAGNOSIS — M6281 Muscle weakness (generalized): Secondary | ICD-10-CM

## 2021-03-22 DIAGNOSIS — R293 Abnormal posture: Secondary | ICD-10-CM | POA: Diagnosis not present

## 2021-03-22 NOTE — Patient Instructions (Signed)
Access Code: [email or web address] URL: [email or web address] Date: 03/22/2021 Prepared by: Almyra Free  Exercises Standing 'L' Stretch at Counter - 2 x daily - 7 x weekly - 1 sets - 2-3 reps - 10-15 seconds hold [place] - 1 x daily - 7 x weekly - 1 sets - 3 reps - 30 sec hold Standing Shoulder Extension with Dowel - 1 x daily - 7 x weekly - 3 reps - 20 seconds hold Standing Shoulder Internal Rotation Stretch with Towel - 1 x daily - 7 x weekly - 1 sets - 3 reps - 30 sec hold Standing Bilateral Low Shoulder Row with Anchored Resistance - 1 x daily - 7 x weekly - 3 sets - 10 reps Shoulder extension with resistance - Neutral - 1 x daily - 7 x weekly - 3 sets - 10 reps Supine Shoulder Horizontal Abduction with Resistance - 1 x daily - 7 x weekly - 3 sets - 10 reps Supine Shoulder External Rotation with Resistance - 1 x daily - 7 x weekly - 3 sets - 10 reps Supine Serratus Punches Resistance - 1 x daily - 7 x weekly - 3 sets - 10 reps Supine Chest Stretch with Elbows Bent - 2 x daily - 7 x weekly - 1 sets - 3 reps - 30-60 sec hold
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
## 2021-03-25 ENCOUNTER — Encounter: Payer: Medicare Other | Admitting: Physical Therapy

## 2021-04-12 ENCOUNTER — Ambulatory Visit (INDEPENDENT_AMBULATORY_CARE_PROVIDER_SITE_OTHER): Payer: Medicare Other | Admitting: Sports Medicine

## 2021-04-12 ENCOUNTER — Other Ambulatory Visit: Payer: Self-pay

## 2021-04-12 DIAGNOSIS — M7551 Bursitis of right shoulder: Secondary | ICD-10-CM

## 2021-04-12 NOTE — Assessment & Plan Note (Signed)
Valerie Carroll returns, she is a pleasant 65 year old female, long history of shoulder pain for decades, more recently she was pruning her flowers, and then noted severe pain over the deltoid, I saw her approximately 6 weeks ago, due to severity of pain and duration we did a subacromial injection, she returns today pain-free, she did some formal physical therapy as well and will continue the exercises at home, continue ibuprofen 800 as needed.

## 2021-04-12 NOTE — Progress Notes (Signed)
    Procedures performed today:    None.  Independent interpretation of notes and tests performed by another provider:   None.  Brief History, Exam, Impression, and Recommendations:    Acute shoulder bursitis, right Valerie Carroll returns, she is a pleasant 65 year old female, long history of shoulder pain for decades, more recently she was pruning her flowers, and then noted severe pain over the deltoid, I saw her approximately 6 weeks ago, due to severity of pain and duration we did a subacromial injection, she returns today pain-free, she did some formal physical therapy as well and will continue the exercises at home, continue ibuprofen 800 as needed.    ___________________________________________ Valerie Carroll, M.D., ABFM., CAQSM. Primary Care and [place] Instructor of [employer] [facility] [employer]

## 2021-04-28 ENCOUNTER — Other Ambulatory Visit: Payer: Self-pay | Admitting: *Deleted

## 2021-04-28 DIAGNOSIS — F411 Generalized anxiety disorder: Secondary | ICD-10-CM

## 2021-04-28 MED ORDER — ESCITALOPRAM OXALATE 20 MG PO TABS: 20.0000 mg | ORAL_TABLET | Freq: Every day | ORAL | 0 refills | Status: DC

## 2021-05-12 ENCOUNTER — Other Ambulatory Visit: Payer: Self-pay | Admitting: Family Medicine

## 2021-05-12 DIAGNOSIS — Z1231 Encounter for screening mammogram for malignant neoplasm of breast: Secondary | ICD-10-CM

## 2021-05-19 ENCOUNTER — Ambulatory Visit (INDEPENDENT_AMBULATORY_CARE_PROVIDER_SITE_OTHER): Payer: Medicare Other

## 2021-05-19 ENCOUNTER — Other Ambulatory Visit: Payer: Self-pay

## 2021-05-19 DIAGNOSIS — Z1231 Encounter for screening mammogram for malignant neoplasm of breast: Secondary | ICD-10-CM

## 2021-06-10 ENCOUNTER — Other Ambulatory Visit: Payer: Self-pay | Admitting: *Deleted

## 2021-06-10 DIAGNOSIS — F411 Generalized anxiety disorder: Secondary | ICD-10-CM

## 2021-06-10 MED ORDER — ALPRAZOLAM 0.5 MG PO TABS: ORAL_TABLET | ORAL | 0 refills | Status: DC

## 2021-07-13 ENCOUNTER — Other Ambulatory Visit: Payer: Self-pay

## 2021-07-13 ENCOUNTER — Other Ambulatory Visit: Payer: Self-pay | Admitting: *Deleted

## 2021-07-13 ENCOUNTER — Ambulatory Visit (INDEPENDENT_AMBULATORY_CARE_PROVIDER_SITE_OTHER): Payer: Medicare Other | Admitting: Family Medicine

## 2021-07-13 ENCOUNTER — Encounter: Payer: Self-pay | Admitting: Family Medicine

## 2021-07-13 VITALS — BP 129/63 | HR 94 | Ht 65.0 in | Wt 172.0 lb

## 2021-07-13 DIAGNOSIS — I10 Essential (primary) hypertension: Secondary | ICD-10-CM

## 2021-07-13 DIAGNOSIS — R7301 Impaired fasting glucose: Secondary | ICD-10-CM | POA: Diagnosis not present

## 2021-07-13 DIAGNOSIS — E7800 Pure hypercholesterolemia, unspecified: Secondary | ICD-10-CM | POA: Diagnosis not present

## 2021-07-13 DIAGNOSIS — E785 Hyperlipidemia, unspecified: Secondary | ICD-10-CM

## 2021-07-13 DIAGNOSIS — E8881 Metabolic syndrome: Secondary | ICD-10-CM | POA: Diagnosis not present

## 2021-07-13 DIAGNOSIS — F411 Generalized anxiety disorder: Secondary | ICD-10-CM

## 2021-07-13 DIAGNOSIS — M7551 Bursitis of right shoulder: Secondary | ICD-10-CM

## 2021-07-13 LAB — POCT GLYCOSYLATED HEMOGLOBIN (HGB A1C): Hemoglobin A1C: 5.5 % (ref 4.0–5.6)

## 2021-07-13 MED ORDER — ESCITALOPRAM OXALATE 10 MG PO TABS: 15.0000 mg | ORAL_TABLET | Freq: Every day | ORAL | 0 refills | Status: DC

## 2021-07-13 MED ORDER — LOSARTAN POTASSIUM-HCTZ 50-12.5 MG PO TABS: 1.0000 | ORAL_TABLET | Freq: Every day | ORAL | 1 refills | Status: DC

## 2021-07-13 NOTE — Assessment & Plan Note (Signed)
A1c looks phenomenal today at 5.5.  Continue current regimen she says she really has not made any major changes.  But plan to follow back up in 6 months.

## 2021-07-13 NOTE — Assessment & Plan Note (Signed)
Well controlled. Continue current regimen. Follow up in  6 mo  

## 2021-07-13 NOTE — Assessment & Plan Note (Signed)
Due to check lipids.  Tolerating statin well without any side effects or problems.

## 2021-07-13 NOTE — Assessment & Plan Note (Signed)
Ports that she has been on the Lexapro for many years probably a couple of decades and she is at the point where she really like to try to come off of it she is not sure that it is really effective anymore anyway.  We discussed going from 20 mg down to 15 mg daily for the next 6 to 12 weeks and after that if she is doing really well then we will go down to 10 mg and taper slowly over time.

## 2021-07-13 NOTE — Assessment & Plan Note (Signed)
Fortunately she started have problems with her right shoulder again but does have an appointment with our sports med doc on Monday she had an injection done in May.
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
## 2021-07-14 LAB — COMPLETE METABOLIC PANEL WITH GFR
AG Ratio: 1.6 (calc) (ref 1.0–2.5)
ALT: 24 U/L (ref 6–29)
AST: 22 U/L (ref 10–35)
Albumin: 4.5 g/dL (ref 3.6–5.1)
Alkaline phosphatase (APISO): 82 U/L (ref 37–153)
BUN: 12 mg/dL (ref 7–25)
CO2: 29 mmol/L (ref 20–32)
Calcium: 9.4 mg/dL (ref 8.6–10.4)
Chloride: 101 mmol/L (ref 98–110)
Creat: 0.62 mg/dL (ref 0.50–1.05)
Globulin: 2.8 g/dL (calc) (ref 1.9–3.7)
Glucose, Bld: 95 mg/dL (ref 65–99)
Potassium: 4.1 mmol/L (ref 3.5–5.3)
Sodium: 141 mmol/L (ref 135–146)
Total Bilirubin: 0.4 mg/dL (ref 0.2–1.2)
Total Protein: 7.3 g/dL (ref 6.1–8.1)
eGFR: 98 mL/min/{1.73_m2} (ref >=60)

## 2021-07-14 LAB — LIPID PANEL
Cholesterol: 176 mg/dL (ref <200)
HDL: 48 mg/dL — ABNORMAL LOW (ref >=50)
LDL Cholesterol (Calc): 97 mg/dL (calc)
Non-HDL Cholesterol (Calc): 128 mg/dL (calc) (ref <130)
Total CHOL/HDL Ratio: 3.7 (calc) (ref <5.0)
Triglycerides: 213 mg/dL — ABNORMAL HIGH (ref <150)

## 2021-07-15 ENCOUNTER — Encounter: Payer: Self-pay | Admitting: Family Medicine

## 2021-07-15 DIAGNOSIS — Z780 Asymptomatic menopausal state: Secondary | ICD-10-CM

## 2021-07-15 DIAGNOSIS — Z13820 Encounter for screening for osteoporosis: Secondary | ICD-10-CM

## 2021-07-15 NOTE — Progress Notes (Signed)
Hi Valerie Carroll, total cholesterol and LDL look good.  Triglycerides are still elevated.  Just continue to work on healthy diet and regular exercise.  Your metabolic panel including liver and kidney function is normal.  Are you okay with getting an up-to-date bone density test?  If so we can get that ordered.

## 2021-07-18 ENCOUNTER — Ambulatory Visit (INDEPENDENT_AMBULATORY_CARE_PROVIDER_SITE_OTHER): Payer: Medicare Other

## 2021-07-18 ENCOUNTER — Ambulatory Visit (INDEPENDENT_AMBULATORY_CARE_PROVIDER_SITE_OTHER): Payer: Medicare Other | Admitting: Sports Medicine

## 2021-07-18 ENCOUNTER — Other Ambulatory Visit: Payer: Self-pay

## 2021-07-18 DIAGNOSIS — M7541 Impingement syndrome of right shoulder: Secondary | ICD-10-CM

## 2021-07-18 NOTE — Assessment & Plan Note (Signed)
Increasing pain right shoulder, last injected about 3 months ago, repeat subacromial injection today, restarting aggressive conditioning exercises, return in 6 weeks.

## 2021-07-18 NOTE — Progress Notes (Signed)
    Procedures performed today:    Procedure: Real-time Ultrasound Guided injection of the right subacromial bursa Device: Samsung HS60  Verbal informed consent obtained.  Time-out conducted.  Noted no overlying erythema, induration, or other signs of local infection.  Skin prepped in a sterile fashion.  Local anesthesia: Topical Ethyl chloride.  With sterile technique and under real time ultrasound guidance: Noted intact rotator cuff, 1 cc Kenalog 40, 1 cc lidocaine, 1 cc bupivacaine injected easily Completed without difficulty  Advised to call if fevers/chills, erythema, induration, drainage, or persistent bleeding.  Images permanently stored and available for review in PACS.  Impression: Technically successful ultrasound guided injection.  Independent interpretation of notes and tests performed by another provider:   None.  Brief History, Exam, Impression, and Recommendations:    Impingement syndrome, shoulder, right Increasing pain right shoulder, last injected about 3 months ago, repeat subacromial injection today, restarting aggressive conditioning exercises, return in 6 weeks.    ___________________________________________ Gwen Her. Dianah Field, M.D., ABFM., CAQSM. Primary Care and [place] Instructor of [place] [facility]

## 2021-07-19 ENCOUNTER — Ambulatory Visit: Payer: Medicare Other | Admitting: Sports Medicine

## 2021-07-25 ENCOUNTER — Ambulatory Visit: Payer: Medicare Other | Admitting: Family Medicine

## 2021-08-01 ENCOUNTER — Other Ambulatory Visit: Payer: Self-pay

## 2021-08-01 ENCOUNTER — Ambulatory Visit (INDEPENDENT_AMBULATORY_CARE_PROVIDER_SITE_OTHER): Payer: Medicare Other

## 2021-08-01 ENCOUNTER — Encounter: Payer: Self-pay | Admitting: Family Medicine

## 2021-08-01 ENCOUNTER — Ambulatory Visit (INDEPENDENT_AMBULATORY_CARE_PROVIDER_SITE_OTHER): Payer: Medicare Other | Admitting: Family Medicine

## 2021-08-01 VITALS — BP 128/71 | HR 74 | Ht 65.0 in | Wt 172.0 lb

## 2021-08-01 DIAGNOSIS — B9689 Other specified bacterial agents as the cause of diseases classified elsewhere: Secondary | ICD-10-CM | POA: Diagnosis not present

## 2021-08-01 DIAGNOSIS — R1900 Intra-abdominal and pelvic swelling, mass and lump, unspecified site: Secondary | ICD-10-CM | POA: Diagnosis not present

## 2021-08-01 DIAGNOSIS — N950 Postmenopausal bleeding: Secondary | ICD-10-CM

## 2021-08-01 DIAGNOSIS — N760 Acute vaginitis: Secondary | ICD-10-CM | POA: Diagnosis not present

## 2021-08-01 IMAGING — US US PELVIS COMPLETE WITH TRANSVAGINAL
1 series · 13 of 25 positions shown · non-contrast
Comparison: None

CLINICAL DATA: Postmenopausal bleeding

EXAM:
TRANSABDOMINAL AND TRANSVAGINAL ULTRASOUND OF PELVIS
TECHNIQUE: Both transabdominal and transvaginal ultrasound examinations of the
pelvis were performed. Transabdominal technique was performed for
global imaging of the pelvis including uterus, ovaries, adnexal
regions, and pelvic cul-de-sac. It was necessary to proceed with
endovaginal exam following the transabdominal exam to visualize the
uterus endometrium ovaries.

[Series 1: us pelvic complete with transvaginal · 13 of 96 slices shown]
[im 1/96]
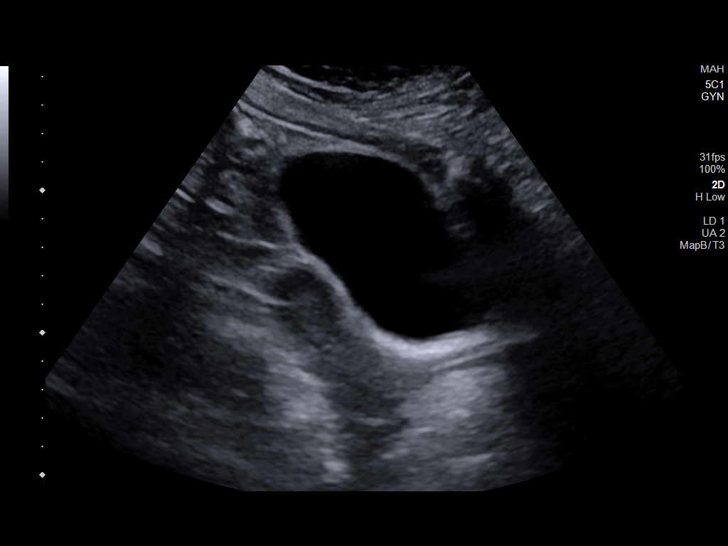
[im 8/96]
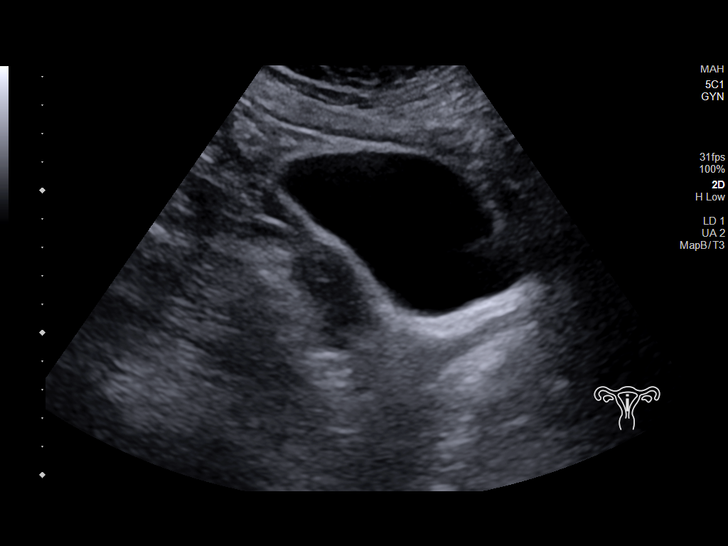
[im 16/96]
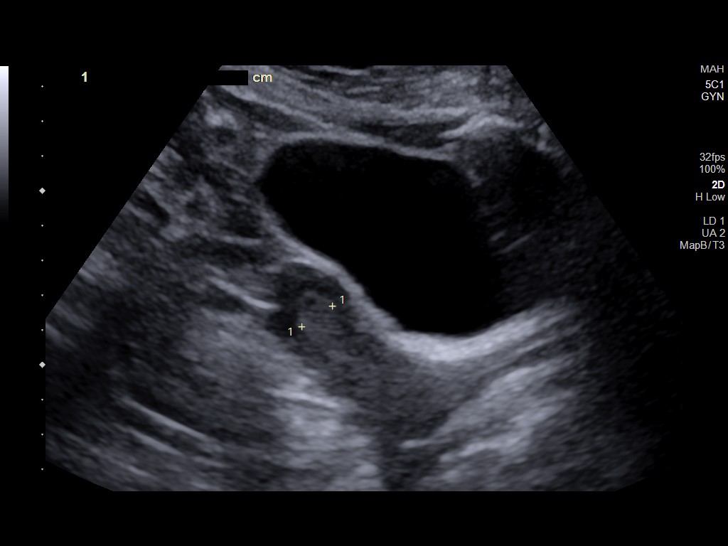
[im 24/96]
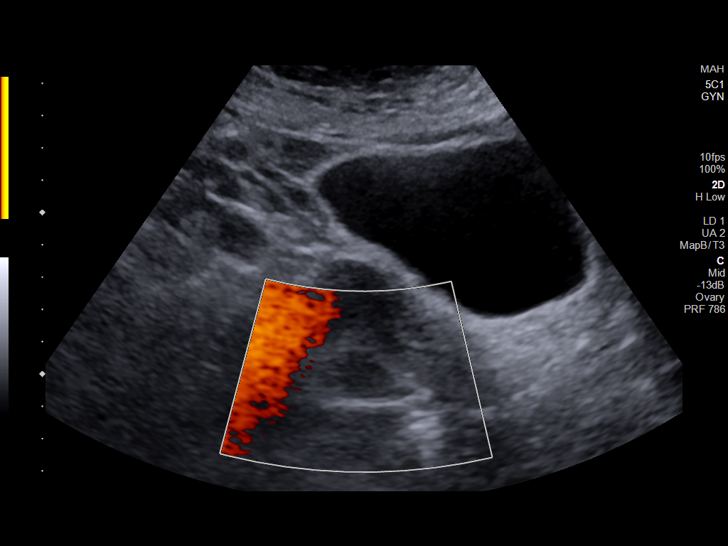
[im 32/96]
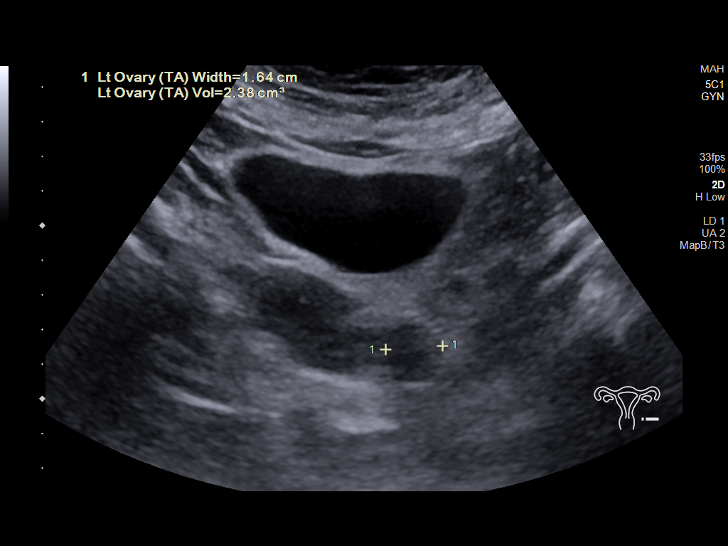
[im 40/96]
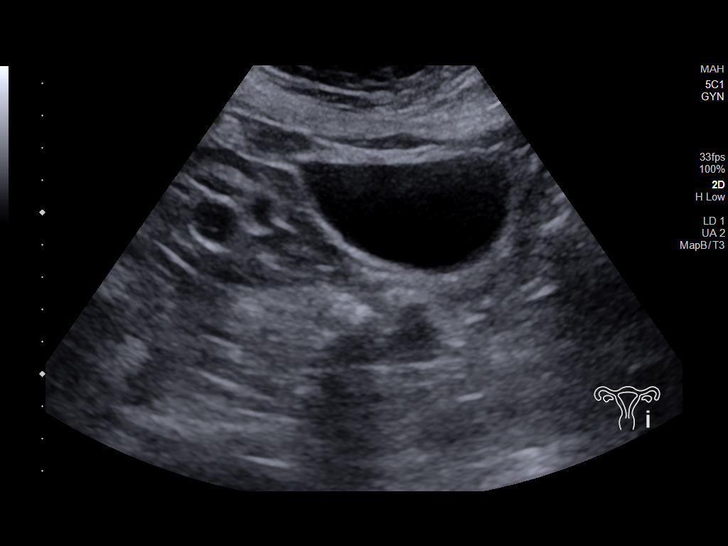
[im 48/96]
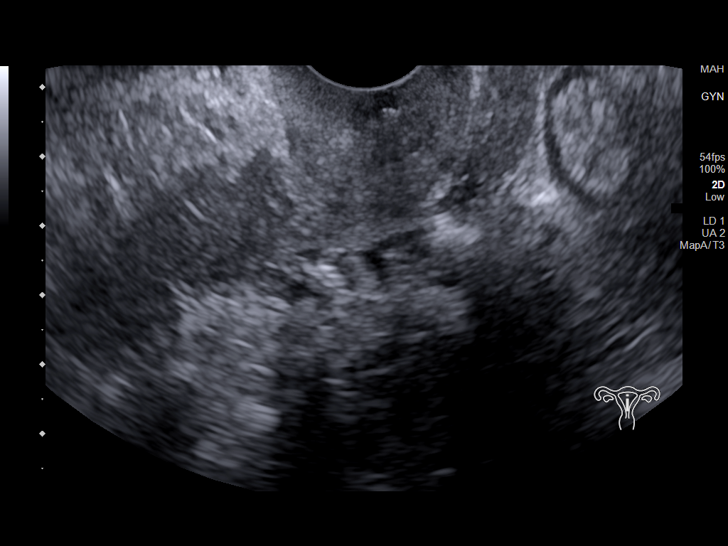
[im 56/96]
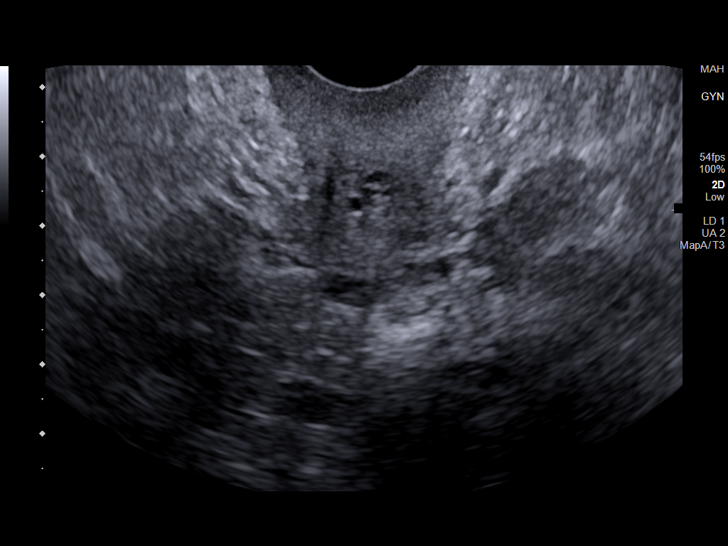
[im 64/96]
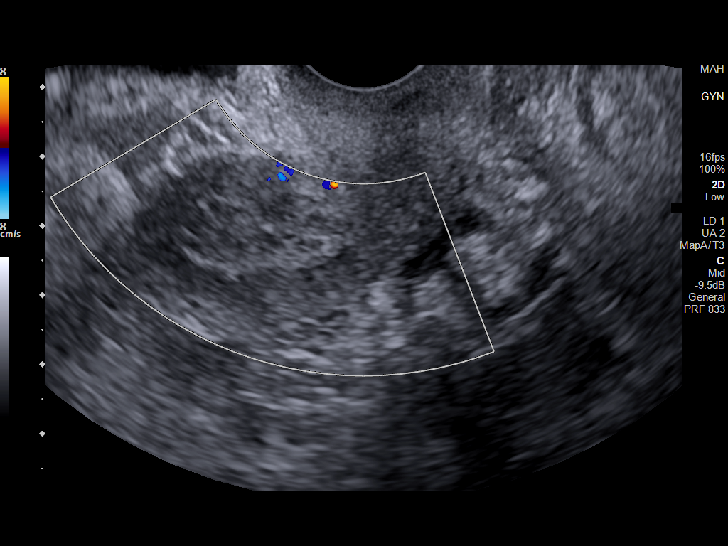
[im 72/96]
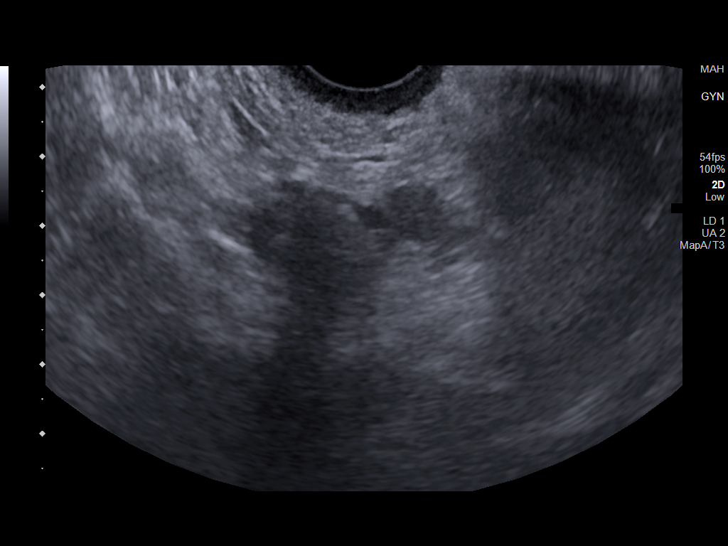
[im 80/96]
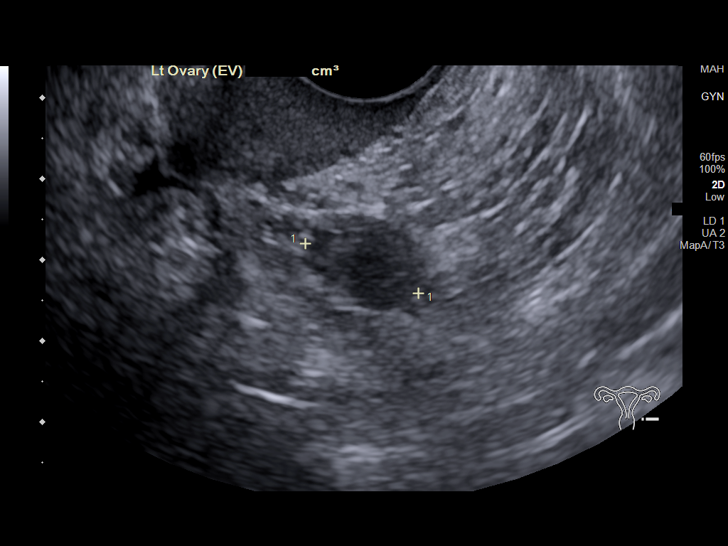
[im 88/96]
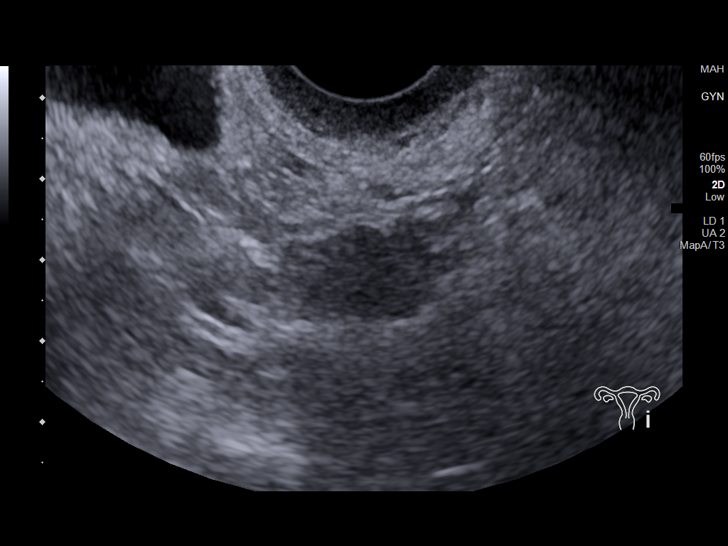
[im 96/96]
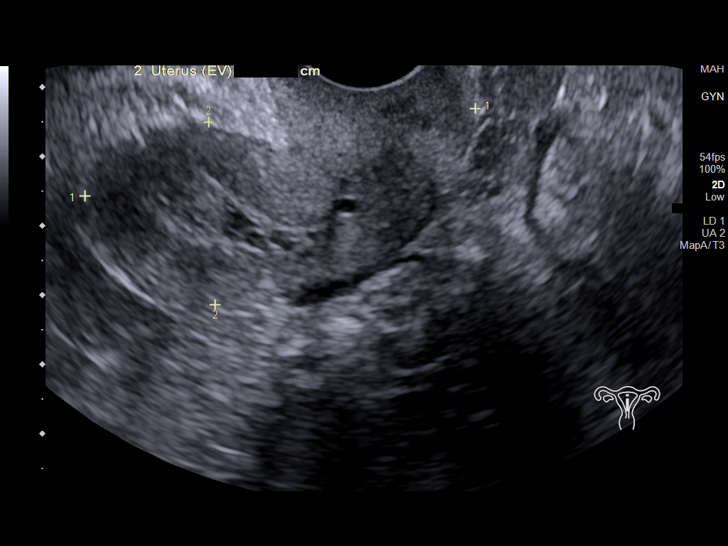

[13 of 25 positions shown; findings below may reference images not displayed]

FINDINGS: Uterus

Measurements: 7 x 2.5 x 4.3 cm = volume: 39.4 mL. No fibroids or
other mass visualized.

Endometrium

Thickness: 10.8 mm. Heterogenous in appearance. Small fluid in the
endometrial canal

Right ovary

Measurements: 3.1 x 1.6 x 1.5 cm = volume: 3.8 mL. Normal
appearance/no adnexal mass.

Left ovary

Measurements: 2.2 x 1.1 x 1.5 cm = volume: 2.0 mL. Normal
appearance/no adnexal mass.

Other findings

No abnormal free fluid.
IMPRESSION: 1. Heterogenous endometrial thickening with small fluid measuring up
to 10.8 mm. In the setting of post-menopausal bleeding, endometrial
sampling is indicated to exclude carcinoma. If results are benign,
sonohysterogram should be considered for focal lesion work-up. (Ref:
Radiological Reasoning: Algorithmic Workup of Abnormal Vaginal
Bleeding with Endovaginal Sonography and Sonohysterography. AJR
[FZ]; 191:[id number])
2. Otherwise negative pelvic ultrasound
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
## 2021-08-02 ENCOUNTER — Encounter: Payer: Self-pay | Admitting: Family Medicine

## 2021-08-02 LAB — SURESWAB® ADVANCED VAGINITIS PLUS,TMA
C. trachomatis RNA, TMA: NOT DETECTED
CANDIDA SPECIES: NOT DETECTED
Candida glabrata: NOT DETECTED
N. gonorrhoeae RNA, TMA: NOT DETECTED
SURESWAB(R) ADV BACTERIAL VAGINOSIS(BV),TMA: POSITIVE — AB
TRICHOMONAS VAGINALIS (TV),TMA: NOT DETECTED

## 2021-08-02 MED ORDER — METRONIDAZOLE 500 MG PO TABS: 500.0000 mg | ORAL_TABLET | Freq: Two times a day (BID) | ORAL | 0 refills | Status: DC

## 2021-08-02 NOTE — Addendum Note (Signed)
Addended by: Beatrice Lecher D on: 08/02/2021 12:18 PM   Modules accepted: Orders

## 2021-08-02 NOTE — Progress Notes (Signed)
Hi Kathy, ultrasound does show a little thickening of the lining which is not normal in people who are postmenopausal so we definitely need to move forward with getting you scheduled for an endometrial biopsy.  Organ to go ahead and place referral to GYN so that they can discuss this with you further its usually an in office procedure which will get a sampling of the cells to make sure that they look normal.

## 2021-08-02 NOTE — Progress Notes (Signed)
Hi Valerie Carroll, swab shows that you do have a little bit of a bacterial infection I do not think this is causing your bleeding but I do want to clear that up some get a send over prescription for metronidazole.

## 2021-08-03 ENCOUNTER — Ambulatory Visit (INDEPENDENT_AMBULATORY_CARE_PROVIDER_SITE_OTHER): Payer: Medicare Other

## 2021-08-03 ENCOUNTER — Other Ambulatory Visit: Payer: Self-pay

## 2021-08-03 DIAGNOSIS — Z780 Asymptomatic menopausal state: Secondary | ICD-10-CM

## 2021-08-03 DIAGNOSIS — Z13820 Encounter for screening for osteoporosis: Secondary | ICD-10-CM | POA: Diagnosis not present

## 2021-08-04 ENCOUNTER — Encounter: Payer: Self-pay | Admitting: Family Medicine

## 2021-08-04 DIAGNOSIS — Z780 Asymptomatic menopausal state: Secondary | ICD-10-CM | POA: Insufficient documentation

## 2021-08-04 NOTE — Progress Notes (Signed)
Hi Valerie Carroll, bone density test shows a T score of -2.0 this is in the category of mildly thin bones called osteopenia.   The current recommendation for osteopenia (mildly thin bones) treatment includes:   #1 calcium-total of 1200 mg of calcium daily.  If you eat a very calcium rich diet you may be able to obtain that without a supplement.  If not, then I recommend calcium 500 mg twice a day.  There are several products over-the-counter such as Caltrate D and Viactiv chews which are great options that contain calcium and vitamin D. #2 vitamin D-recommend 800 international units daily. #3 exercise-recommend 30 minutes of weightbearing exercise 3 days a week.  Resistance training ,such as doing bands and light weights, can be particularly helpful.
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
## 2021-08-15 ENCOUNTER — Ambulatory Visit (INDEPENDENT_AMBULATORY_CARE_PROVIDER_SITE_OTHER): Payer: Medicare Other | Admitting: Cardiology

## 2021-08-15 ENCOUNTER — Encounter: Payer: Self-pay | Admitting: Cardiology

## 2021-08-15 ENCOUNTER — Other Ambulatory Visit: Payer: Self-pay

## 2021-08-15 VITALS — BP 131/79 | HR 86 | Ht 64.5 in | Wt 171.1 lb

## 2021-08-15 DIAGNOSIS — E7800 Pure hypercholesterolemia, unspecified: Secondary | ICD-10-CM | POA: Diagnosis not present

## 2021-08-15 DIAGNOSIS — I10 Essential (primary) hypertension: Secondary | ICD-10-CM | POA: Diagnosis not present

## 2021-08-15 DIAGNOSIS — R072 Precordial pain: Secondary | ICD-10-CM

## 2021-08-15 DIAGNOSIS — R0609 Other forms of dyspnea: Secondary | ICD-10-CM

## 2021-08-15 NOTE — Patient Instructions (Signed)

## 2021-08-16 ENCOUNTER — Ambulatory Visit: Payer: Medicare Other | Admitting: Family Medicine

## 2021-08-18 ENCOUNTER — Other Ambulatory Visit: Payer: Self-pay

## 2021-08-18 ENCOUNTER — Other Ambulatory Visit (HOSPITAL_COMMUNITY)
Admission: RE | Admit: 2021-08-18 | Discharge: 2021-08-18 | Disposition: A | Discharge status: Home / Self Care | Payer: Medicare Other | Source: Ambulatory Visit | Attending: Obstetrics and Gynecology | Admitting: Obstetrics and Gynecology

## 2021-08-18 ENCOUNTER — Ambulatory Visit (INDEPENDENT_AMBULATORY_CARE_PROVIDER_SITE_OTHER): Payer: Medicare Other | Admitting: Obstetrics and Gynecology

## 2021-08-18 ENCOUNTER — Encounter: Payer: Self-pay | Admitting: Obstetrics and Gynecology

## 2021-08-18 VITALS — BP 128/70 | HR 90 | Resp 16 | Ht 64.5 in | Wt 169.0 lb

## 2021-08-18 DIAGNOSIS — N950 Postmenopausal bleeding: Secondary | ICD-10-CM | POA: Diagnosis not present

## 2021-08-18 DIAGNOSIS — R9389 Abnormal findings on diagnostic imaging of other specified body structures: Secondary | ICD-10-CM | POA: Insufficient documentation

## 2021-08-18 NOTE — Progress Notes (Signed)
      GYNECOLOGY OFFICE PROCEDURE NOTE   Valerie Carroll is a 65 y.o. G2P2000 here for endometrial biopsy for PMB. Recent ultrasound also showed EL 10.8 mm. Normal findings otherwise.  Of note, pap on 11/2020 was normal, negative HPV.   ENDOMETRIAL BIOPSY     The indications for endometrial biopsy were reviewed.   Risks of the biopsy including cramping, bleeding, infection, uterine perforation, inadequate specimen and need for additional procedures were discussed. Offered alternative of hysteroscopy, dilation and curettage in OR. The patient states she understands the R/B/I/A and agrees to undergo procedure today. Urine pregnancy test was Not indicated. Consent was signed. Time out was performed.    Patient was positioned in dorsal lithotomy position. A vaginal speculum was placed.  The cervix was visualized and was prepped with Betadine.  A single-toothed tenaculum was placed on the anterior lip of the cervix to stabilize it. The 3 mm pipelle was easily introduced into the endometrial cavity without difficulty to a depth of 6 cm, and a Scant amount of tissue was obtained after two passes and sent to pathology. The instruments were removed from the patient's vagina. Minimal bleeding from the cervix was noted. The patient tolerated the procedure well.   Patient was given post procedure instructions.  Will follow up pathology and manage accordingly; patient will be contacted with results and recommendations.  Routine preventative health maintenance measures emphasized.     Radene Gunning, MD, [place] for [facility], [place]

## 2021-08-22 LAB — SURGICAL PATHOLOGY

## 2021-08-30 ENCOUNTER — Ambulatory Visit: Payer: Medicare Other | Admitting: Sports Medicine

## 2021-09-19 ENCOUNTER — Encounter: Payer: Medicare Other | Admitting: Obstetrics & Gynecology

## 2021-10-03 ENCOUNTER — Other Ambulatory Visit: Payer: Self-pay | Admitting: Family Medicine

## 2021-10-03 DIAGNOSIS — F411 Generalized anxiety disorder: Secondary | ICD-10-CM

## 2021-10-04 ENCOUNTER — Other Ambulatory Visit: Payer: Self-pay | Admitting: Neurology

## 2021-10-04 DIAGNOSIS — E785 Hyperlipidemia, unspecified: Secondary | ICD-10-CM

## 2021-10-04 MED ORDER — SIMVASTATIN 40 MG PO TABS: ORAL_TABLET | ORAL | 2 refills | Status: DC

## 2021-10-05 ENCOUNTER — Other Ambulatory Visit: Payer: Self-pay | Admitting: Family Medicine

## 2021-10-05 DIAGNOSIS — F411 Generalized anxiety disorder: Secondary | ICD-10-CM

## 2021-10-05 MED ORDER — ESCITALOPRAM OXALATE 20 MG PO TABS
20.0000 mg | ORAL_TABLET | Freq: Every day | ORAL | 1 refills | Status: DC
Start: 2021-10-05 — End: 2022-01-18

## 2021-10-13 ENCOUNTER — Other Ambulatory Visit: Payer: Self-pay | Admitting: Family Medicine

## 2021-10-13 DIAGNOSIS — F411 Generalized anxiety disorder: Secondary | ICD-10-CM

## 2021-10-13 DIAGNOSIS — I10 Essential (primary) hypertension: Secondary | ICD-10-CM

## 2021-11-16 ENCOUNTER — Ambulatory Visit: Payer: Medicare Other | Admitting: Sports Medicine

## 2021-11-21 ENCOUNTER — Other Ambulatory Visit: Payer: Self-pay

## 2021-11-21 ENCOUNTER — Ambulatory Visit (INDEPENDENT_AMBULATORY_CARE_PROVIDER_SITE_OTHER): Payer: Medicare Other

## 2021-11-21 ENCOUNTER — Ambulatory Visit (INDEPENDENT_AMBULATORY_CARE_PROVIDER_SITE_OTHER): Payer: Medicare Other | Admitting: Sports Medicine

## 2021-11-21 DIAGNOSIS — M7541 Impingement syndrome of right shoulder: Secondary | ICD-10-CM

## 2021-11-21 NOTE — Progress Notes (Signed)
° ° °  Procedures performed today:    Procedure: Real-time Ultrasound Guided injection of the right glenohumeral joint Device: Samsung HS60  Verbal informed consent obtained.  Time-out conducted.  Noted no overlying erythema, induration, or other signs of local infection.  Skin prepped in a sterile fashion.  Local anesthesia: Topical Ethyl chloride.  With sterile technique and under real time ultrasound guidance: Noted arthritic joint, 1 cc Kenalog 40, 2 cc lidocaine, 2 cc bupivacaine injected easily Completed without difficulty  Advised to call if fevers/chills, erythema, induration, drainage, or persistent bleeding.  Images permanently stored and available for review in PACS.  Impression: Technically successful ultrasound guided injection.  Independent interpretation of notes and tests performed by another provider:   None.  Brief History, Exam, Impression, and Recommendations:    Impingement syndrome, shoulder, right Pleasant 66 year old female, chronic right shoulder pain, historically impingement symptoms were treated with subacromial injections, last injected back in October, more recently she is having increasing pain in a different location, mostly at the glenohumeral joint with positive crank sign, pain with external rotation, x-rays did show glenohumeral osteoarthritis, today we injected her glenohumeral joint, return to see me in 4 to 6 weeks.    ___________________________________________ Gwen Her. Dianah Field, M.D., ABFM., CAQSM. Primary Care and Cross Instructor of [place] [facility] of Medicine

## 2021-11-21 NOTE — Assessment & Plan Note (Signed)
Pleasant 66 year old female, chronic right shoulder pain, historically impingement symptoms were treated with subacromial injections, last injected back in October, more recently she is having increasing pain in a different location, mostly at the glenohumeral joint with positive crank sign, pain with external rotation, x-rays did show glenohumeral osteoarthritis, today we injected her glenohumeral joint, return to see me in 4 to 6 weeks.

## 2021-12-05 ENCOUNTER — Telehealth: Payer: Self-pay | Admitting: *Deleted

## 2021-12-05 NOTE — Telephone Encounter (Signed)
Returned call from 1:24 PM. Left patient a message to call and schedule appointment with Dr. Damita Dunnings.
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
## 2021-12-15 ENCOUNTER — Encounter: Payer: Self-pay | Admitting: Obstetrics and Gynecology

## 2021-12-15 ENCOUNTER — Other Ambulatory Visit: Payer: Self-pay

## 2021-12-15 ENCOUNTER — Ambulatory Visit (INDEPENDENT_AMBULATORY_CARE_PROVIDER_SITE_OTHER): Payer: Medicare Other | Admitting: Obstetrics and Gynecology

## 2021-12-15 VITALS — BP 152/77 | HR 91 | Ht 65.0 in | Wt 168.0 lb

## 2021-12-15 DIAGNOSIS — N950 Postmenopausal bleeding: Secondary | ICD-10-CM | POA: Diagnosis not present

## 2021-12-19 ENCOUNTER — Ambulatory Visit: Payer: Medicare Other | Admitting: Sports Medicine

## 2021-12-25 DIAGNOSIS — N950 Postmenopausal bleeding: Secondary | ICD-10-CM
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
## 2021-12-31 ENCOUNTER — Other Ambulatory Visit: Payer: Self-pay | Admitting: Family Medicine

## 2022-01-02 ENCOUNTER — Other Ambulatory Visit: Payer: Self-pay

## 2022-01-02 ENCOUNTER — Encounter (HOSPITAL_BASED_OUTPATIENT_CLINIC_OR_DEPARTMENT_OTHER): Payer: Self-pay | Admitting: Obstetrics and Gynecology

## 2022-01-02 NOTE — Progress Notes (Signed)
Spoke w/ via phone for pre-op interview--- pt ?Lab needs dos----  [facility], t&s, ekg             ?Lab results------ no ?COVID test -----patient states asymptomatic no test needed ?Arrive at ------- 7989 on 01-04-2022 ?NPO after MN NO Solid Food.  Clear liquids from MN until--- 0645 ?Med rec completed ?Medications to take morning of surgery ----- lexapro ?Diabetic medication ----- n/a ?Patient instructed no nail polish to be worn day of surgery ?Patient instructed to bring photo id and insurance card day of surgery ?Patient aware to have Driver (ride ) / caregiver for 24 hours after surgery -- husband, Juanda Crumble ?Patient Special Instructions ----- n/a ?Pre-Op special Istructions ----- n/a ?Patient verbalized understanding of instructions that were given at this phone interview. ?Patient denies shortness of breath, chest pain, fever, cough at this phone interview.  ?

## 2022-01-04 ENCOUNTER — Ambulatory Visit (HOSPITAL_BASED_OUTPATIENT_CLINIC_OR_DEPARTMENT_OTHER)
Admission: RE | Admit: 2022-01-04 | Discharge: 2022-01-04 | Disposition: A | Discharge status: Home / Self Care | Payer: Medicare Other | Source: Ambulatory Visit | Attending: Obstetrics and Gynecology | Admitting: Obstetrics and Gynecology

## 2022-01-04 ENCOUNTER — Encounter (HOSPITAL_BASED_OUTPATIENT_CLINIC_OR_DEPARTMENT_OTHER): Payer: Self-pay | Admitting: Obstetrics and Gynecology

## 2022-01-04 ENCOUNTER — Encounter (HOSPITAL_BASED_OUTPATIENT_CLINIC_OR_DEPARTMENT_OTHER)
Admission: RE | Disposition: A | Discharge status: Home / Self Care | Payer: Self-pay | Source: Ambulatory Visit | Attending: Obstetrics and Gynecology

## 2022-01-04 ENCOUNTER — Other Ambulatory Visit: Payer: Self-pay

## 2022-01-04 ENCOUNTER — Ambulatory Visit (HOSPITAL_BASED_OUTPATIENT_CLINIC_OR_DEPARTMENT_OTHER): Payer: Medicare Other | Admitting: Anesthesiology

## 2022-01-04 DIAGNOSIS — N840 Polyp of corpus uteri: Secondary | ICD-10-CM | POA: Diagnosis not present

## 2022-01-04 DIAGNOSIS — N950 Postmenopausal bleeding: Secondary | ICD-10-CM | POA: Diagnosis present

## 2022-01-04 DIAGNOSIS — N841 Polyp of cervix uteri: Secondary | ICD-10-CM

## 2022-01-04 DIAGNOSIS — I10 Essential (primary) hypertension: Secondary | ICD-10-CM | POA: Insufficient documentation

## 2022-01-04 DIAGNOSIS — F17200 Nicotine dependence, unspecified, uncomplicated: Secondary | ICD-10-CM | POA: Diagnosis not present

## 2022-01-04 DIAGNOSIS — R9389 Abnormal findings on diagnostic imaging of other specified body structures: Secondary | ICD-10-CM

## 2022-01-04 DIAGNOSIS — R7303 Prediabetes: Secondary | ICD-10-CM | POA: Diagnosis not present

## 2022-01-04 DIAGNOSIS — F419 Anxiety disorder, unspecified: Secondary | ICD-10-CM | POA: Insufficient documentation

## 2022-01-04 HISTORY — DX: Postmenopausal bleeding: N95.0

## 2022-01-04 HISTORY — DX: Personal history of other malignant neoplasm of skin: Z85.828

## 2022-01-04 HISTORY — DX: Presence of spectacles and contact lenses: Z97.3

## 2022-01-04 HISTORY — DX: Restless legs syndrome: G25.81

## 2022-01-04 HISTORY — DX: Generalized anxiety disorder: F41.1

## 2022-01-04 HISTORY — DX: Prediabetes: R73.03

## 2022-01-04 HISTORY — DX: Unspecified osteoarthritis, unspecified site: M19.90

## 2022-01-04 HISTORY — DX: Personal history of other malignant neoplasm of skin: Z98.890

## 2022-01-04 HISTORY — PX: HYSTEROSCOPY WITH D & C: SHX1775

## 2022-01-04 LAB — POCT I-STAT, CHEM 8
BUN: 10 mg/dL (ref 8–23)
Calcium, Ion: 1.05 mmol/L — ABNORMAL LOW (ref 1.15–1.40)
Chloride: 100 mmol/L (ref 98–111)
Creatinine, Ser: 0.5 mg/dL (ref 0.44–1.00)
Glucose, Bld: 110 mg/dL — ABNORMAL HIGH (ref 70–99)
HCT: 42 % (ref 36.0–46.0)
Hemoglobin: 14.3 g/dL (ref 12.0–15.0)
Potassium: 3.9 mmol/L (ref 3.5–5.1)
Sodium: 138 mmol/L (ref 135–145)
TCO2: 30 mmol/L (ref 22–32)

## 2022-01-04 LAB — TYPE AND SCREEN
ABO/RH(D): A POS
Antibody Screen: NEGATIVE

## 2022-01-04 SURGERY — DILATATION AND CURETTAGE /HYSTEROSCOPY
Anesthesia: General | Site: Uterus

## 2022-01-04 MED ORDER — ONDANSETRON HCL 4 MG/2ML IJ SOLN
INTRAMUSCULAR | Status: DC | PRN
  Administered 2022-01-04: 4 mg via INTRAVENOUS

## 2022-01-04 MED ORDER — PHENYLEPHRINE 40 MCG/ML (10ML) SYRINGE FOR IV PUSH (FOR BLOOD PRESSURE SUPPORT)
PREFILLED_SYRINGE | INTRAVENOUS | Status: AC
  Filled 2022-01-04: qty 10

## 2022-01-04 MED ORDER — FENTANYL CITRATE (PF) 100 MCG/2ML IJ SOLN
INTRAMUSCULAR | Status: AC
  Filled 2022-01-04: qty 2

## 2022-01-04 MED ORDER — MIDAZOLAM HCL 2 MG/2ML IJ SOLN
INTRAMUSCULAR | Status: DC | PRN
  Administered 2022-01-04: 2 mg via INTRAVENOUS

## 2022-01-04 MED ORDER — OXYCODONE HCL 5 MG/5ML PO SOLN: 5.0000 mg | Freq: Once | ORAL | Status: DC | PRN

## 2022-01-04 MED ORDER — FENTANYL CITRATE (PF) 100 MCG/2ML IJ SOLN
INTRAMUSCULAR | Status: DC | PRN
  Administered 2022-01-04 (×2): 25 ug via INTRAVENOUS
  Administered 2022-01-04: 50 ug via INTRAVENOUS

## 2022-01-04 MED ORDER — KETOROLAC TROMETHAMINE 30 MG/ML IJ SOLN
INTRAMUSCULAR | Status: DC | PRN
  Administered 2022-01-04: 30 mg via INTRAVENOUS

## 2022-01-04 MED ORDER — ONDANSETRON HCL 4 MG/2ML IJ SOLN: 4.0000 mg | Freq: Once | INTRAMUSCULAR | Status: DC | PRN

## 2022-01-04 MED ORDER — KETOROLAC TROMETHAMINE 15 MG/ML IJ SOLN: 15.0000 mg | INTRAMUSCULAR | Status: DC

## 2022-01-04 MED ORDER — IBUPROFEN 800 MG PO TABS: 800.0000 mg | ORAL_TABLET | Freq: Three times a day (TID) | ORAL | 0 refills | Status: DC | PRN

## 2022-01-04 MED ORDER — LIDOCAINE 2% (20 MG/ML) 5 ML SYRINGE
INTRAMUSCULAR | Status: DC | PRN
Start: 2022-01-04 — End: 2022-01-04
  Administered 2022-01-04: 60 mg via INTRAVENOUS

## 2022-01-04 MED ORDER — KETOROLAC TROMETHAMINE 30 MG/ML IJ SOLN
INTRAMUSCULAR | Status: AC
  Filled 2022-01-04: qty 1

## 2022-01-04 MED ORDER — DEXAMETHASONE SODIUM PHOSPHATE 10 MG/ML IJ SOLN
INTRAMUSCULAR | Status: AC
  Filled 2022-01-04: qty 1

## 2022-01-04 MED ORDER — ACETAMINOPHEN 500 MG PO TABS
1000.0000 mg | ORAL_TABLET | ORAL | Status: AC
  Administered 2022-01-04: 1000 mg via ORAL

## 2022-01-04 MED ORDER — DEXAMETHASONE SODIUM PHOSPHATE 10 MG/ML IJ SOLN
INTRAMUSCULAR | Status: DC | PRN
  Administered 2022-01-04 (×2): 5 mg via INTRAVENOUS

## 2022-01-04 MED ORDER — POVIDONE-IODINE 10 % EX SWAB: 2.0000 "application " | Freq: Once | CUTANEOUS | Status: DC

## 2022-01-04 MED ORDER — ONDANSETRON HCL 4 MG/2ML IJ SOLN
INTRAMUSCULAR | Status: AC
Start: 2022-01-04 — End: ?
  Filled 2022-01-04: qty 2

## 2022-01-04 MED ORDER — LIDOCAINE HCL (PF) 2 % IJ SOLN
INTRAMUSCULAR | Status: AC
  Filled 2022-01-04: qty 5

## 2022-01-04 MED ORDER — ACETAMINOPHEN 500 MG PO TABS
ORAL_TABLET | ORAL | Status: AC
  Filled 2022-01-04: qty 2

## 2022-01-04 MED ORDER — ACETAMINOPHEN 325 MG PO TABS: 650.0000 mg | ORAL_TABLET | Freq: Four times a day (QID) | ORAL | 0 refills | Status: DC | PRN

## 2022-01-04 MED ORDER — PROPOFOL 10 MG/ML IV BOLUS
INTRAVENOUS | Status: DC | PRN
  Administered 2022-01-04: 150 mg via INTRAVENOUS

## 2022-01-04 MED ORDER — OXYCODONE HCL 5 MG PO TABS: 5.0000 mg | ORAL_TABLET | Freq: Once | ORAL | Status: DC | PRN

## 2022-01-04 MED ORDER — FENTANYL CITRATE (PF) 100 MCG/2ML IJ SOLN: 25.0000 ug | INTRAMUSCULAR | Status: DC | PRN

## 2022-01-04 MED ORDER — LACTATED RINGERS IV SOLN: INTRAVENOUS | Status: DC

## 2022-01-04 MED ORDER — MIDAZOLAM HCL 2 MG/2ML IJ SOLN
INTRAMUSCULAR | Status: AC
  Filled 2022-01-04: qty 2

## 2022-01-04 MED ORDER — SODIUM CHLORIDE 0.9 % IR SOLN
Status: DC | PRN
  Administered 2022-01-04: 3000 mL

## 2022-01-04 SURGICAL SUPPLY — 14 items
CATH ROBINSON RED A/P 16FR (CATHETERS) IMPLANT
DEVICE MYOSURE LITE (MISCELLANEOUS) IMPLANT
DEVICE MYOSURE REACH (MISCELLANEOUS) IMPLANT
DILATOR CANAL MILEX (MISCELLANEOUS) IMPLANT
ELECT REM PT RETURN 9FT ADLT (ELECTROSURGICAL)
ELECTRODE REM PT RTRN 9FT ADLT (ELECTROSURGICAL) IMPLANT
GLOVE SURG ENC MOIS LTX SZ6 (GLOVE) ×2 IMPLANT
GOWN STRL REUS W/ TWL LRG LVL3 (GOWN DISPOSABLE) ×2 IMPLANT
GOWN STRL REUS W/TWL LRG LVL3 (GOWN DISPOSABLE) ×4
KIT PROCEDURE FLUENT (KITS) ×2 IMPLANT
PACK VAGINAL MINOR WOMEN LF (CUSTOM PROCEDURE TRAY) ×2 IMPLANT
PAD OB MATERNITY 4.3X12.25 (PERSONAL CARE ITEMS) ×2 IMPLANT
SEAL ROD LENS SCOPE MYOSURE (ABLATOR) ×2 IMPLANT
TOWEL OR 17X26 10 PK STRL BLUE (TOWEL DISPOSABLE) ×4 IMPLANT

## 2022-01-04 NOTE — Transfer of Care (Signed)
Immediate Anesthesia Transfer of Care Note ? ?Patient: Valerie Carroll ? ?Procedure(s) Performed: Procedure(s) (LRB): ?DILATATION AND CURETTAGE /HYSTEROSCOPY/POLYPECTOMY (N/A) ? ?Patient Location: PACU ? ?Anesthesia Type: General ? ?Level of Consciousness: awake, oriented, sedated and patient cooperative ? ?Airway & Oxygen Therapy: Patient Spontanous Breathing and Patient connected to face mask oxygen ? ?Post-op Assessment: Report given to PACU RN and Post -op Vital signs reviewed and stable ? ?Post vital signs: Reviewed and stable ? ?Complications: No apparent anesthesia complications ?Last Vitals:  ?Vitals Value Taken Time  ?BP 140/71 01/04/22 0944  ?Temp 36.4 ?C 01/04/22 0944  ?Pulse 72 01/04/22 0944  ?Resp 15 01/04/22 0944  ?SpO2 98 % 01/04/22 0944  ?Vitals shown include unvalidated device data. ? ?Last Pain:  ?Vitals:  ? 01/04/22 0819  ?TempSrc: Oral  ?PainSc: 0-No pain  ?   ? ?Patients Stated Pain Goal: 0 (01/04/22 0814) ? ?Complications: No notable events documented. ?

## 2022-01-04 NOTE — Op Note (Signed)
01/04/22 ?9:40 AM ? ?Preoperative Diagnosis: PMB, thickened EL 27m ?Postoperative Diagnosis: Same ?Procedure: D&C, hysteroscopy, polypectomy ? ?Surgeon: Dr. DDamita Dunnings?Assistant: None ?EBL 5 cc ?UOP: none, not drained ?Complications: None ? ?Specimens: EMC/ECC/polyp ? ?Findings: NEFG, normal appearing cervix. Small 1-254mpolyp in the endocervical canal and 1 cm polyp in the uterus. [place] visualized bilaterally ? ?Description of the procedure: ?Preop antibiotics not indicated. Informed consent reviewed and signed. Pt given opportunity to ask questions.  ? ?Pt prepped and draped in the dorsal lithotomy fashion after LMA anesthesia found to be adequate. Timeout performed.  ? ?Open sided speculum placed into the patient's vagina. Single tooth tenaculum applied to the 12 o'clock position of the cervix. Cervix progressively dilated to a 19 Pratt. 30 degree hysteroscope was inserted into the cavity with the aforementioned findings noted. Polyp forceps inserted and grasped polyp which was removed with ease. ECC and EMC performed. Cervical polyp still present so ECC repeated and hysteroscope re-inserted and polyp was gone. Endometrial polyp was also gone following the procedure and no remaining cavitary defects.  ? ?Hemostatic at the end of the procedure. Procedure completed. All instruments removed. Counts correct x2. ? ?Pt taken to recovery room in stable condition. ? ?PaRadene GunningMD ?Attending ObNorth [place]Faculty Practice ?[facility] for [facility] ? ?

## 2022-01-04 NOTE — Anesthesia Preprocedure Evaluation (Addendum)
Anesthesia Evaluation  ?Patient identified by MRN, date of birth, ID band ?Patient awake ? ? ? ?Reviewed: ?Allergy & Precautions, NPO status , Patient's Chart, lab work & pertinent test results ? ?History of Anesthesia Complications ?Negative for: history of anesthetic complications ? ?Airway ?Mallampati: II ? ?TM Distance: >3 FB ?Neck ROM: Full ? ? ? Dental ? ?(+) Dental Advisory Given ?  ?Pulmonary ?Current SmokerPatient did not abstain from smoking.,  ?  ?Pulmonary exam normal ? ? ? ? ? ? ? Cardiovascular ?hypertension, Pt. on medications ?Normal cardiovascular exam ? ? ?  ?Neuro/Psych ?PSYCHIATRIC DISORDERS Anxiety  Neuromuscular disease (RLS)   ? GI/Hepatic ?negative GI ROS, Neg liver ROS,   ?Endo/Other  ? ?Pre-DM ? ? Renal/GU ?negative Renal ROS  ? ?  ?Musculoskeletal ? ?(+) Arthritis ,  ? Abdominal ?  ?Peds ? Hematology ?negative hematology ROS ?(+)   ?Anesthesia Other Findings ? ? Reproductive/Obstetrics ? ?  ? ? ? ? ? ? ? ? ? ? ? ? ? ?  ?  ? ? ? ? ? ? ? ?Anesthesia Physical ?Anesthesia Plan ? ?ASA: 2 ? ?Anesthesia Plan: General  ? ?Post-op Pain Management: Tylenol PO (pre-op)* and Minimal or no pain anticipated  ? ?Induction: Intravenous ? ?PONV Risk Score and Plan: 3 and Treatment may vary due to age or medical condition, Ondansetron and Dexamethasone ? ?Airway Management Planned: LMA ? ?Additional Equipment: None ? ?Intra-op Plan:  ? ?Post-operative Plan: Extubation in OR ? ?Informed Consent: I have reviewed the patients History and Physical, chart, labs and discussed the procedure including the risks, benefits and alternatives for the proposed anesthesia with the patient or authorized representative who has indicated his/her understanding and acceptance.  ? ? ? ?Dental advisory given ? ?Plan Discussed with: CRNA and Anesthesiologist ? ?Anesthesia Plan Comments:   ? ? ? ? ? ?Anesthesia Quick Evaluation ? ?

## 2022-01-04 NOTE — Anesthesia Postprocedure Evaluation (Signed)
Anesthesia Post Note ? ?Patient: Valerie Carroll ? ?Procedure(s) Performed: DILATATION AND CURETTAGE /HYSTEROSCOPY/POLYPECTOMY (Uterus) ? ?  ? ?Patient location during evaluation: PACU ?Anesthesia Type: General ?Level of consciousness: awake and alert ?Pain management: pain level controlled ?Vital Signs Assessment: post-procedure vital signs reviewed and stable ?Respiratory status: spontaneous breathing, nonlabored ventilation and respiratory function stable ?Cardiovascular status: stable and blood pressure returned to baseline ?Anesthetic complications: no ? ? ?No notable events documented. ? ?Last Vitals:  ?Vitals:  ? 01/04/22 1015 01/04/22 1045  ?BP: (!) 136/50 129/76  ?Pulse: 65 61  ?Resp: 12 14  ?Temp: (!) 36.4 ?C 36.6 ?C  ?SpO2: 96% 98%  ?  ?Last Pain:  ?Vitals:  ? 01/04/22 1045  ?TempSrc:   ?PainSc: 0-No pain  ? ? ?  ?  ?  ?  ?  ?  ? ?Audry Pili ? ? ? ? ?

## 2022-01-04 NOTE — Interval H&P Note (Signed)
History and Physical Interval Note: ? ?01/04/2022 ?8:36 AM ? ?Valerie Carroll  has presented today for surgery, with the diagnosis of PMB.  The various methods of treatment have been discussed with the patient and family. After consideration of risks, benefits and other options for treatment, the patient has consented to  Procedure(s): ?DILATATION AND CURETTAGE /HYSTEROSCOPY (N/A) as a surgical intervention.  The patient's history has been reviewed, patient examined, no change in status, stable for surgery.  I have reviewed the patient's chart and labs.  Questions were answered to the patient's satisfaction.   ? ?Also discussed with [name] in the unlikely event of uterine perforation how would she wish to proceed - she would prefer laparoscopic guided completion of the case.  ? ? ?Radene Gunning ? ? ?

## 2022-01-04 NOTE — Anesthesia Procedure Notes (Signed)
Procedure Name: LMA Insertion ?Date/Time: 01/04/2022 9:14 AM ?Performed by: Suan Halter, CRNA ?Pre-anesthesia Checklist: Patient identified, Emergency Drugs available, Suction available and Patient being monitored ?Patient Re-evaluated:Patient Re-evaluated prior to induction ?Oxygen Delivery Method: Circle system utilized ?Preoxygenation: Pre-oxygenation with 100% oxygen ?Induction Type: IV induction ?Ventilation: Mask ventilation without difficulty ?LMA: LMA inserted ?LMA Size: 4.0 ?Number of attempts: 1 ?Airway Equipment and Method: Bite block ?Placement Confirmation: positive ETCO2 ?Tube secured with: Tape ?Dental Injury: Teeth and Oropharynx as per pre-operative assessment  ? ? ? ? ?

## 2022-01-04 NOTE — Discharge Instructions (Signed)

## 2022-01-05 ENCOUNTER — Encounter (HOSPITAL_BASED_OUTPATIENT_CLINIC_OR_DEPARTMENT_OTHER): Payer: Self-pay | Admitting: Obstetrics and Gynecology

## 2022-01-05 LAB — SURGICAL PATHOLOGY

## 2022-01-08 ENCOUNTER — Other Ambulatory Visit: Payer: Self-pay | Admitting: Family Medicine

## 2022-01-08 DIAGNOSIS — I10 Essential (primary) hypertension: Secondary | ICD-10-CM

## 2022-01-08 DIAGNOSIS — F411 Generalized anxiety disorder: Secondary | ICD-10-CM

## 2022-01-11 ENCOUNTER — Ambulatory Visit: Payer: Medicare Other | Admitting: Family Medicine

## 2022-01-18 ENCOUNTER — Ambulatory Visit (INDEPENDENT_AMBULATORY_CARE_PROVIDER_SITE_OTHER): Payer: Medicare Other | Admitting: Family Medicine

## 2022-01-18 ENCOUNTER — Encounter: Payer: Self-pay | Admitting: Family Medicine

## 2022-01-18 VITALS — BP 123/83 | HR 74 | Resp 16 | Ht 65.0 in | Wt 169.0 lb

## 2022-01-18 DIAGNOSIS — F411 Generalized anxiety disorder: Secondary | ICD-10-CM

## 2022-01-18 DIAGNOSIS — I10 Essential (primary) hypertension: Secondary | ICD-10-CM

## 2022-01-18 MED ORDER — ESCITALOPRAM OXALATE 10 MG PO TABS: ORAL_TABLET | ORAL | 0 refills | Status: DC

## 2022-01-18 MED ORDER — SERTRALINE HCL 25 MG PO TABS: ORAL_TABLET | ORAL | 0 refills | Status: DC

## 2022-01-18 NOTE — Progress Notes (Signed)
? ?Established Patient Office Visit ? ?Subjective:  ?Patient ID: Valerie Carroll, female    DOB: Feb 22, 1956  Age: 66 y.o. MRN: [id number] ? ?CC:  ?Chief Complaint  ?Patient presents with  ? Hypertension  ?  Follow up   ? Depression  ?  Anxiety follow up. Patient states Lexapro is no longer helping with her depression and anxiety. Patient would like to discuss a different medication.   ? ? ?HPI ?Girl Schissler presents for  ? ?Hypertension- Pt denies chest pain, SOB, dizziness, or heart palpitations.  Taking meds as directed w/o problems.  Denies medication side effects.   ? ?Follow-up depression-feels like the Lexapro $RemoveBef'20mg'VRuyWowEmF$  is no longer effective.  She would like to consider changing medications.  She had tried to taper down on the medication back in the fall but says she started getting brain zaps to just went back up to the 20 mg.  She has tried Effexor and Paxil in the fat past.  She remembers having side effects on the Effexor.  In retirement she just has days where she just feels a little bored and sometimes even may be a little lonely.  She has great family.  She tries to get her husband out sometimes but he does not really want to get out.  And her children live minimum 4 hours away but she does try to see them pretty regularly. ? ?Past Medical History:  ?Diagnosis Date  ? Arthritis   ? right shoulder  ? GAD (generalized anxiety disorder)   ? History of basal cell carcinoma (BCC) excision   ? History of melanoma excision 11/2020  ? s/p  MOH's surgery on nose , localized  ? Hyperlipidemia   ? Hypertension   ? followed by pcp   (hx evaluated by cardiology for pre-corial pain and abnormal ekg by dr Stanford Breed,  normal CCTA 01-21-2021 no disease)  ? PMB (postmenopausal bleeding)   ? Pre-diabetes   ? RLS (restless legs syndrome)   ? Wears contact lenses   ? ? ?Past Surgical History:  ?Procedure Laterality Date  ? HYSTEROSCOPY WITH D & C N/A 01/04/2022  ? Procedure: DILATATION AND CURETTAGE /HYSTEROSCOPY/POLYPECTOMY;   Surgeon: Radene Gunning, MD;  Location: [facility];  Service: Gynecology;  Laterality: N/A;  ? KNEE ARTHROSCOPY Left   ? 1980, 1981  ? MOHS SURGERY  01/2021  ? [place]  ? age 66  ? ? ?Family History  ?Problem Relation Age of Onset  ? Heart attack Mother 92  ? Stroke Mother   ? Breast cancer Mother 44  ? COPD Mother   ?     smoker  ? ? ?Social History  ? ?Socioeconomic History  ? Marital status: Married  ?  Spouse name: Juanda Crumble  ? Number of children: 2  ? Years of education: 86  ? Highest education level: Not on file  ?Occupational History  ?  Employer: [employer]  ?Tobacco Use  ? Smoking status: Every Day  ?  Packs/day: 1.00  ?  Years: 52.00  ?  Pack years: 52.00  ?  Types: Cigarettes  ? Smokeless tobacco: Never  ? Tobacco comments:  ?  1 pack a day   ?Vaping Use  ? Vaping Use: Never used  ?Substance and Sexual Activity  ? Alcohol use: Yes  ?  Comment: 2 glasses wine daily  ? Drug use: No  ? Sexual activity: Not on file  ?Other Topics Concern  ? Not on file  ?  Social History Narrative  ? 1 drink caffiene daily.  No regular exercise.     ? ?Social Determinants of Health  ? ?Financial Resource Strain: Not on file  ?Food Insecurity: Not on file  ?Transportation Needs: Not on file  ?Physical Activity: Not on file  ?Stress: Not on file  ?Social Connections: Not on file  ?Intimate Partner Violence: Not on file  ? ? ?Outpatient Medications Prior to Visit  ?Medication Sig Dispense Refill  ? ALPRAZolam (XANAX) 0.5 MG tablet TAKE 1/2 TO 1 TABLET BY MOUTH TWO TIMES A DAY AS NEEDED FOR ANXIETY.  USE SPARINGLY. 60 tablet 0  ? aspirin EC 81 MG tablet Take 81 mg by mouth daily. Swallow whole.    ? ibuprofen (ADVIL) 800 MG tablet Take 1 tablet (800 mg total) by mouth 3 (three) times daily with meals as needed for headache, moderate pain or cramping. 60 tablet 0  ? losartan-hydrochlorothiazide (HYZAAR) 50-12.5 MG tablet TAKE ONE TABLET BY MOUTH DAILY 90 tablet 1  ? simvastatin (ZOCOR) 40 MG  tablet TAKE ONE TABLET BY MOUTH EVERY EVENING AT 6 (Patient taking differently: TAKE ONE TABLET BY MOUTH EVERY EVENING AT 6) 90 tablet 2  ? acetaminophen (TYLENOL) 325 MG tablet Take 2 tablets (650 mg total) by mouth every 6 (six) hours as needed. 60 tablet 0  ? escitalopram (LEXAPRO) 20 MG tablet Take 1 tablet (20 mg total) by mouth daily. 90 tablet 1  ? ?No facility-administered medications prior to visit.  ? ? ?Allergies  ?Allergen Reactions  ? Citalopram Hydrobromide Nausea Only  ? Effexor [Venlafaxine] Other (See Comments)  ?  Brain zaps, and feeling mentally sluggish  ? Lisinopril Cough  ? ? ?ROS ?Review of Systems ? ?  ?Objective:  ?  ?Physical Exam ?Constitutional:   ?   Appearance: Normal appearance. She is well-developed.  ?HENT:  ?   Head: Normocephalic and atraumatic.  ?Cardiovascular:  ?   Rate and Rhythm: Normal rate and regular rhythm.  ?   Heart sounds: Normal heart sounds.  ?Pulmonary:  ?   Effort: Pulmonary effort is normal.  ?   Breath sounds: Normal breath sounds.  ?Skin: ?   General: Skin is warm and dry.  ?Neurological:  ?   Mental Status: She is alert and oriented to person, place, and time.  ?Psychiatric:     ?   Behavior: Behavior normal.  ? ? ?BP 123/83   Pulse 74   Resp 16   Ht $R'5\' 5"'Ho$  (1.651 m)   Wt 169 lb (76.7 kg)   SpO2 95%   BMI 28.12 kg/m?  ?Wt Readings from Last 3 Encounters:  ?01/18/22 169 lb (76.7 kg)  ?01/04/22 170 lb 14.4 oz (77.5 kg)  ?12/15/21 168 lb (76.2 kg)  ? ? ? ?There are no preventive care reminders to display for this patient. ? ?There are no preventive care reminders to display for this patient. ? ?Lab Results  ?Component Value Date  ? TSH 1.10 07/28/2016  ? ?Lab Results  ?Component Value Date  ? WBC 6.9 11/09/2020  ? HGB 14.3 01/04/2022  ? HCT 42.0 01/04/2022  ? MCV 94.1 11/09/2020  ? PLT 271 11/09/2020  ? ?Lab Results  ?Component Value Date  ? NA 138 01/04/2022  ? K 3.9 01/04/2022  ? CO2 29 07/13/2021  ? GLUCOSE 110 (H) 01/04/2022  ? BUN 10 01/04/2022  ?  CREATININE 0.50 01/04/2022  ? BILITOT 0.4 07/13/2021  ? ALKPHOS 65 04/19/2017  ? AST 22 07/13/2021  ?  ALT 24 07/13/2021  ? PROT 7.3 07/13/2021  ? ALBUMIN 4.1 04/19/2017  ? CALCIUM 9.4 07/13/2021  ? EGFR 98 07/13/2021  ? ?Lab Results  ?Component Value Date  ? CHOL 176 07/13/2021  ? ?Lab Results  ?Component Value Date  ? HDL 48 (L) 07/13/2021  ? ?Lab Results  ?Component Value Date  ? [place] 97 07/13/2021  ? ?Lab Results  ?Component Value Date  ? TRIG 213 (H) 07/13/2021  ? ?Lab Results  ?Component Value Date  ? CHOLHDL 3.7 07/13/2021  ? ?Lab Results  ?Component Value Date  ? HGBA1C 5.5 07/13/2021  ? ? ?  ?Assessment & Plan:  ? ?Problem List Items Addressed This Visit   ? ?  ? Cardiovascular and Mediastinum  ? Essential hypertension, benign - Primary  ?  Well controlled. Continue current regimen. Follow up in  6 mo  ?  ?  ?  ? Other  ? Generalized anxiety disorder  ?    GAD-7 score of 6 today and PHQ-9 score of 12.  Discussed options we will taper down the Lexapro and start sertraline she is very worried about how she will feel because when she tried to taper off the Lexapro she had a lot of brain zaps.  So we can certainly overlap them. ?  ?  ? Relevant Medications  ? escitalopram (LEXAPRO) 10 MG tablet  ? sertraline (ZOLOFT) 25 MG tablet  ? ? ?Meds ordered this encounter  ?Medications  ? escitalopram (LEXAPRO) 10 MG tablet  ?  Sig: Take 1.5 tablets (15 mg total) by mouth daily for 7 days, THEN 1 tablet (10 mg total) daily for 7 days, THEN 0.5 tablets (5 mg total) daily for 7 days.  ?  Dispense:  21 tablet  ?  Refill:  0  ? sertraline (ZOLOFT) 25 MG tablet  ?  Sig: Take 1 tablet (25 mg total) by mouth daily for 7 days, THEN 2 tablets (50 mg total) daily for 23 days.  ?  Dispense:  53 tablet  ?  Refill:  0  ? ? ?Follow-up: Return in about 8 weeks (around 03/17/2022) for New start medication.  ?I spent 30 minutes on the day of the encounter to include pre-visit record review, face-to-face time with the patient and post  visit ordering of test. ? ? ? ?Beatrice Lecher, MD ?

## 2022-01-18 NOTE — Patient Instructions (Signed)
Follow the taper instructions on the Lexapro.  Once you get down to the 10 mg dose you can go ahead and start the 25 mg sertraline.  They will overlap as you are tapering off of the Lexapro and then going up on the sertraline. ?

## 2022-01-18 NOTE — Assessment & Plan Note (Signed)
Well controlled. Continue current regimen. Follow up in  6 mo  

## 2022-01-18 NOTE — Assessment & Plan Note (Signed)
GAD-7 score of 6 today and PHQ-9 score of 12.  Discussed options we will taper down the Lexapro and start sertraline she is very worried about how she will feel because when she tried to taper off the Lexapro she had a lot of brain zaps.  So we can certainly overlap them. ?

## 2022-01-23 ENCOUNTER — Ambulatory Visit (INDEPENDENT_AMBULATORY_CARE_PROVIDER_SITE_OTHER): Payer: Medicare Other | Admitting: Family Medicine

## 2022-01-23 DIAGNOSIS — Z122 Encounter for screening for malignant neoplasm of respiratory organs: Secondary | ICD-10-CM

## 2022-01-23 DIAGNOSIS — Z0000 Encounter for general adult medical examination without abnormal findings: Secondary | ICD-10-CM

## 2022-01-23 DIAGNOSIS — F17200 Nicotine dependence, unspecified, uncomplicated: Secondary | ICD-10-CM | POA: Diagnosis not present

## 2022-01-23 NOTE — Patient Instructions (Addendum)
?MEDICARE ANNUAL WELLNESS VISIT ?Health Maintenance Summary and Written Plan of Care ? ?Valerie Carroll , ? ?Thank you for allowing me to perform your Medicare Annual Wellness Visit and for your ongoing commitment to your health.  ? ?Health Maintenance & Immunization History ?Health Maintenance  ?Topic Date Due  ?? COVID-19 Vaccine (4 - Booster for Pfizer series) 02/03/2022 (Originally 09/15/2020)  ?? Zoster Vaccines- Shingrix (1 of 2) 04/19/2022 (Originally 03/18/1974)  ?? Pneumonia Vaccine 67+ Years old (1 - PCV) 01/19/2023 (Originally 03/18/1961)  ?? INFLUENZA VACCINE  05/09/2022  ?? MAMMOGRAM  05/19/2022  ?? TETANUS/TDAP  07/22/2022  ?? Fecal DNA (Cologuard)  04/15/2023  ?? DEXA SCAN  Completed  ?? Hepatitis C Screening  Completed  ?? HPV VACCINES  Aged Out  ?? COLONOSCOPY (Pts 45-42yr Insurance coverage will need to be confirmed)  Discontinued  ? ?Immunization History  ?Administered Date(s) Administered  ?? Fluad Quad(high Dose 65+) 07/21/2020  ?? Influenza Split 07/22/2012, 08/23/2019  ?? Influenza-Unspecified 05/20/2021  ?? PFIZER(Purple Top)SARS-COV-2 Vaccination 11/18/2019, 12/13/2019, 07/21/2020  ?? Td 03/01/2006  ?? Tdap 07/22/2012  ?? Zoster, Live 09/18/2012  ? ? ?These are the patient goals that we discussed: ? Goals Addressed   ?  ?  ?  ?  ?  ? This Visit's Progress  ? ?  Patient Stated (pt-stated)     ?   01/23/2022 ?AWV Goal: Exercise for General Health ? ?Patient will verbalize understanding of the benefits of increased physical activity: ?Exercising regularly is important. It will improve your overall fitness, flexibility, and endurance. ?Regular exercise also will improve your overall health. It can help you control your weight, reduce stress, and improve your bone density. ?Over the next year, patient will increase physical activity as tolerated with a goal of at least 150 minutes of moderate physical activity per week.  ?You can tell that you are exercising at a moderate intensity if your heart starts  beating faster and you start breathing faster but can still hold a conversation. ?Moderate-intensity exercise ideas include: ?Walking 1 mile (1.6 km) in about 15 minutes ?Biking ?Hiking ?Golfing ?Dancing ?Water aerobics ?Patient will verbalize understanding of everyday activities that increase physical activity by providing examples like the following: ?Yard work, such as: ?Pushing a lConservation officer, nature?Raking and bagging leaves ?Washing your car ?Pushing a stroller ?Shoveling snow ?Gardening ?Washing windows or floors ?Patient will be able to explain general safety guidelines for exercising:  ?Before you start a new exercise program, talk with your health care provider. ?Do not exercise so much that you hurt yourself, feel dizzy, or get very short of breath. ?Wear comfortable clothes and wear shoes with good support. ?Drink plenty of water while you exercise to prevent dehydration or heat stroke. ?Work out until your breathing and your heartbeat get faster.  ?  ?  ?  ? ?This is a list of Health Maintenance Items that are overdue or due now: ?Pneumococcal vaccine  ?Shingrix vaccine ? ?Orders/Referrals Placed Today: ?Orders Placed This Encounter  ?Procedures  ?? Ambulatory Referral for Lung Cancer Scre  ?  Referral Priority:   Routine  ?  Referral Type:   Consultation  ?  Referral Reason:   Specialty Services Required  ?  Number of Visits Requested:   1  ? ?(Contact our referral department at [phone] you have not spoken with someone about your referral appointment within the next 5 days)  ? ? ?Follow-up Plan ?Follow-up with MHali Marry MD as planned ?Schedule your shingrix and  pneumonia vaccine at your pharmacy. ?Medicare wellness visit in one year.  ?Patient will access AVS on my chart. ? ? ? ?  ?Health Maintenance, Female ?Adopting a healthy lifestyle and getting preventive care are important in promoting health and wellness. Ask your health care provider about: ?The right schedule for you to have  regular tests and exams. ?Things you can do on your own to prevent diseases and keep yourself healthy. ?What should I know about diet, weight, and exercise? ?Eat a healthy diet ? ?Eat a diet that includes plenty of vegetables, fruits, low-fat dairy products, and lean protein. ?Do not eat a lot of foods that are high in solid fats, added sugars, or sodium. ?Maintain a healthy weight ?Body mass index (BMI) is used to identify weight problems. It estimates body fat based on height and weight. Your health care provider can help determine your BMI and help you achieve or maintain a healthy weight. ?Get regular exercise ?Get regular exercise. This is one of the most important things you can do for your health. Most adults should: ?Exercise for at least 150 minutes each week. The exercise should increase your heart rate and make you sweat (moderate-intensity exercise). ?Do strengthening exercises at least twice a week. This is in addition to the moderate-intensity exercise. ?Spend less time sitting. Even light physical activity can be beneficial. ?Watch cholesterol and blood lipids ?Have your blood tested for lipids and cholesterol at 67 years of age, then have this test every 5 years. ?Have your cholesterol levels checked more often if: ?Your lipid or cholesterol levels are high. ?You are older than 67 years of age. ?You are at high risk for heart disease. ?What should I know about cancer screening? ?Depending on your health history and family history, you may need to have cancer screening at various ages. This may include screening for: ?Breast cancer. ?Cervical cancer. ?Colorectal cancer. ?Skin cancer. ?Lung cancer. ?What should I know about heart disease, diabetes, and high blood pressure? ?Blood pressure and heart disease ?High blood pressure causes heart disease and increases the risk of stroke. This is more likely to develop in people who have high blood pressure readings or are overweight. ?Have your blood pressure  checked: ?Every 3-5 years if you are 55-26 years of age. ?Every year if you are 58 years old or older. ?Diabetes ?Have regular diabetes screenings. This checks your fasting blood sugar level. Have the screening done: ?Once every three years after age 10 if you are at a normal weight and have a low risk for diabetes. ?More often and at a younger age if you are overweight or have a high risk for diabetes. ?What should I know about preventing infection? ?Hepatitis B ?If you have a higher risk for hepatitis B, you should be screened for this virus. Talk with your health care provider to find out if you are at risk for hepatitis B infection. ?Hepatitis C ?Testing is recommended for: ?Everyone born from [birth year] through 1966. ?Anyone with known risk factors for hepatitis C. ?Sexually transmitted infections (STIs) ?Get screened for STIs, including gonorrhea and chlamydia, if: ?You are sexually active and are younger than 67 years of age. ?You are older than 67 years of age and your health care provider tells you that you are at risk for this type of infection. ?Your sexual activity has changed since you were last screened, and you are at increased risk for chlamydia or gonorrhea. Ask your health care provider if you are at risk. ?Ask your  health care provider about whether you are at high risk for HIV. Your health care provider may recommend a prescription medicine to help prevent HIV infection. If you choose to take medicine to prevent HIV, you should first get tested for HIV. You should then be tested every 3 months for as long as you are taking the medicine. ?Pregnancy ?If you are about to stop having your period (premenopausal) and you may become pregnant, seek counseling before you get pregnant. ?Take 400 to 800 micrograms (mcg) of folic acid every day if you become pregnant. ?Ask for birth control (contraception) if you want to prevent pregnancy. ?Osteoporosis and menopause ?Osteoporosis is a disease in which the bones  lose minerals and strength with aging. This can result in bone fractures. If you are 30 years old or older, or if you are at risk for osteoporosis and fractures, ask your health care provider if you should: ?

## 2022-01-23 NOTE — Progress Notes (Signed)
? ? ?MEDICARE ANNUAL WELLNESS VISIT ? ?01/23/2022 ? ?Telephone Visit Disclaimer ?This Medicare AWV was conducted by telephone due to national recommendations for restrictions regarding the COVID-19 Pandemic (e.g. social distancing).  I verified, using two identifiers, that I am speaking with Valerie Carroll or their authorized healthcare agent. I discussed the limitations, risks, security, and privacy concerns of performing an evaluation and management service by telephone and the potential availability of an in-person appointment in the future. The patient expressed understanding and agreed to proceed.  ?Location of Patient: Home ?Location of Provider (nurse):  In the office. ? ?Subjective:  ? ? ?Valerie Carroll is a 66 y.o. female patient of Metheney, Rene Kocher, MD who had a Medicare Annual Wellness Visit today via telephone. Valerie Carroll is Retired and lives with their spouse. she has 2 children. she reports that she is socially active and does interact with friends/family regularly. she is minimally physically active and enjoys reading and spending time with friends. ? ?Patient Care Team: ?Hali Marry, MD as PCP - General ? ? ?  01/23/2022  ?  2:03 PM 01/04/2022  ?  8:12 AM  ?Advanced Directives  ?Does Patient Have a Medical Advance Directive? Yes Yes  ?Type of Advance Directive Living will Healthcare Power of Attorney  ?Does patient want to make changes to medical advance directive? No - Patient declined   ?Copy of [place] in Chart? No - copy requested No - copy requested  ? ? ?Hospital Utilization Over the Past 12 Months: ?# of hospitalizations or ER visits: 0 ?# of surgeries: 1 ? ?Review of Systems    ?Patient reports that her overall health is unchanged compared to last year. ? ?History obtained from chart review and the patient ? ?Patient Reported Readings (BP, Pulse, CBG, Weight, etc) ?none ? ?Pain Assessment ?Pain : No/denies pain ?Pain Score: 0-No pain ? ?  ? ?Current Medications &  Allergies (verified) ?Allergies as of 01/23/2022   ? ?   Reactions  ? Citalopram Hydrobromide Nausea Only  ? Effexor [venlafaxine] Other (See Comments)  ? Brain zaps, and feeling mentally sluggish  ? Lisinopril Cough  ? ?  ? ?  ?Medication List  ?  ? ?  ? Accurate as of January 23, 2022  2:16 PM. If you have any questions, ask your nurse or doctor.  ?  ?  ? ?  ? ?ALPRAZolam 0.5 MG tablet ?Commonly known as: Duanne Moron ?TAKE 1/2 TO 1 TABLET BY MOUTH TWO TIMES A DAY AS NEEDED FOR ANXIETY.  USE SPARINGLY. ?  ?aspirin EC 81 MG tablet ?Take 81 mg by mouth daily. Swallow whole. ?  ?escitalopram 10 MG tablet ?Commonly known as: Lexapro ?Take 1.5 tablets (15 mg total) by mouth daily for 7 days, THEN 1 tablet (10 mg total) daily for 7 days, THEN 0.5 tablets (5 mg total) daily for 7 days. ?Start taking on: January 18, 2022 ?  ?ibuprofen 800 MG tablet ?Commonly known as: ADVIL ?Take 1 tablet (800 mg total) by mouth 3 (three) times daily with meals as needed for headache, moderate pain or cramping. ?  ?losartan-hydrochlorothiazide 50-12.5 MG tablet ?Commonly known as: HYZAAR ?TAKE ONE TABLET BY MOUTH DAILY ?  ?sertraline 25 MG tablet ?Commonly known as: ZOLOFT ?Take 1 tablet (25 mg total) by mouth daily for 7 days, THEN 2 tablets (50 mg total) daily for 23 days. ?Start taking on: January 18, 2022 ?  ?simvastatin 40 MG tablet ?Commonly known as: ZOCOR ?TAKE ONE TABLET BY  MOUTH EVERY EVENING AT 6 ?  ? ?  ? ? ?History (reviewed): ?Past Medical History:  ?Diagnosis Date  ? Arthritis   ? right shoulder  ? GAD (generalized anxiety disorder)   ? History of basal cell carcinoma (BCC) excision   ? History of melanoma excision 11/2020  ? s/p  MOH's surgery on nose , localized  ? Hyperlipidemia   ? Hypertension   ? followed by pcp   (hx evaluated by cardiology for pre-corial pain and abnormal ekg by dr Stanford Breed,  normal CCTA 01-21-2021 no disease)  ? PMB (postmenopausal bleeding)   ? Pre-diabetes   ? RLS (restless legs syndrome)   ? Wears contact  lenses   ? ?Past Surgical History:  ?Procedure Laterality Date  ? HYSTEROSCOPY WITH D & C N/A 01/04/2022  ? Procedure: DILATATION AND CURETTAGE /HYSTEROSCOPY/POLYPECTOMY;  Surgeon: Radene Gunning, MD;  [facility];  Service: Gynecology;  Laterality: N/A;  ? KNEE ARTHROSCOPY Left   ? 1980, 1981  ? MOHS SURGERY  01/2021  ? [place]  ? age 62  ? ?Family History  ?Problem Relation Age of Onset  ? Heart attack Mother 30  ? Stroke Mother   ? Breast cancer Mother 39  ? COPD Mother   ?     smoker  ? ?Social History  ? ?Socioeconomic History  ? Marital status: Married  ?  Spouse name: Valerie Carroll  ? Number of children: 2  ? Years of education: 54  ? Highest education level: Associate degree: academic program  ?Occupational History  ?  Employer: [employer]  ? Occupation: Retired  ?Tobacco Use  ? Smoking status: Every Day  ?  Packs/day: 1.00  ?  Years: 52.00  ?  Pack years: 52.00  ?  Types: Cigarettes  ? Smokeless tobacco: Never  ? Tobacco comments:  ?  1 pack a day   ?Vaping Use  ? Vaping Use: Never used  ?Substance and Sexual Activity  ? Alcohol use: Yes  ?  Alcohol/week: 14.0 standard drinks  ?  Types: 14 Glasses of wine per week  ?  Comment: 2 glasses wine daily  ? Drug use: No  ? Sexual activity: Not on file  ?Other Topics Concern  ? Not on file  ?Social History Narrative  ? Lives with husband. She enjoys reading and spending time with friends.  ? ?Social Determinants of Health  ? ?Financial Resource Strain: Low Risk   ? Difficulty of Paying Living Expenses: Not hard at all  ?Food Insecurity: No Food Insecurity  ? Worried About Charity fundraiser in the Last Year: Never true  ? Ran Out of Food in the Last Year: Never true  ?Transportation Needs: No Transportation Needs  ? Lack of Transportation (Medical): No  ? Lack of Transportation (Non-Medical): No  ?Physical Activity: Inactive  ? Days of Exercise per Week: 0 days  ? Minutes of Exercise per Session: 0 min  ?Stress: No  Stress Concern Present  ? Feeling of Stress : Not at all  ?Social Connections: Moderately Isolated  ? Frequency of Communication with Friends and Family: More than three times a week  ? Frequency of Social Gatherings with Friends and Family: Once a week  ? Attends Religious Services: Never  ? Active Member of Clubs or Organizations: No  ? Attends Archivist Meetings: Never  ? Marital Status: Married  ? ? ?Activities of Daily Living ? ?  01/23/2022  ?  2:06 PM 01/04/2022  ?  8:21 AM  ?In your present state of health, do you have any difficulty performing the following activities:  ?Hearing? 0 0  ?Vision? 0 0  ?Difficulty concentrating or making decisions? 1 0  ?Comment making decisions sometimes.   ?Walking or climbing stairs? 0 0  ?Dressing or bathing? 0 0  ?Doing errands, shopping? 0   ?Preparing Food and eating ? N   ?Using the Toilet? N   ?In the past six months, have you accidently leaked urine? N   ?Do you have problems with loss of bowel control? N   ?Managing your Medications? N   ?Managing your Finances? N   ?Housekeeping or managing your Housekeeping? N   ? ? ?Patient Education/ Literacy ?How often do you need to have someone help you when you read instructions, pamphlets, or other written materials from your doctor or pharmacy?: 1 - Never ?What is the last grade level you completed in school?: Associates degree ? ?Exercise ?Current Exercise Habits: The patient does not participate in regular exercise at present, Exercise limited by: None identified ? ?Diet ?Patient reports consuming 2 meals a day and 1 snack(s) a day ?Patient reports that her primary diet is: Regular ?Patient reports that she does have regular access to food.  ? ?Depression Screen ? ?  01/23/2022  ?  2:03 PM 01/18/2022  ? 10:54 AM 07/13/2021  ?  9:28 AM 11/10/2020  ?  4:50 PM 03/31/2020  ?  8:38 AM 09/23/2019  ? 10:39 AM 09/19/2018  ?  8:32 AM  ?PHQ 2/9 Scores  ?PHQ - 2 Score 0 '5 4 3 '$ 0 0 2  ?PHQ- 9 Score '1 12 13 9   4  '$ ?  ? ?Fall  Risk ? ?  01/23/2022  ?  2:03 PM 01/18/2022  ?  9:32 AM 07/13/2021  ?  9:18 AM 03/31/2020  ?  8:38 AM 09/23/2019  ? 10:39 AM  ?Fall Risk   ?Falls in the past year? 0 0 0 0 0  ?Number falls in past yr: 0 0 0  0  ?In[place]

## 2022-01-30 NOTE — Progress Notes (Signed)
? ?  GYNECOLOGY OFFICE VISIT NOTE ? ?History:  ? Valerie Carroll is a 66 y.o. G2P2000 here today for postop check s/p D&C, polypectomy, hysteroscopy.  ? ?Her surgery was without complications. Her pathology was benign and showed endometrial polyp as expected.   ? ?She denies any abnormal vaginal discharge, bleeding, pelvic pain or other concerns. ? ? ?  ?Past Medical History:  ?Diagnosis Date  ? Arthritis   ? right shoulder  ? GAD (generalized anxiety disorder)   ? History of basal cell carcinoma (BCC) excision   ? History of melanoma excision 11/2020  ? s/p  MOH's surgery on nose , localized  ? Hyperlipidemia   ? Hypertension   ? followed by pcp   (hx evaluated by cardiology for pre-corial pain and abnormal ekg by dr Stanford Breed,  normal CCTA 01-21-2021 no disease)  ? PMB (postmenopausal bleeding)   ? Pre-diabetes   ? RLS (restless legs syndrome)   ? Wears contact lenses   ? ? ?Past Surgical History:  ?Procedure Laterality Date  ? HYSTEROSCOPY WITH D & C N/A 01/04/2022  ? Procedure: DILATATION AND CURETTAGE /HYSTEROSCOPY/POLYPECTOMY;  Surgeon: Radene Gunning, MD;  Location: [facility];  Service: Gynecology;  Laterality: N/A;  ? KNEE ARTHROSCOPY Left   ? 1980, 1981  ? MOHS SURGERY  01/2021  ? [place]  ? age 36  ? ? ?The following portions of the patient's history were reviewed and updated as appropriate: allergies, current medications, past family history, past medical history, past social history, past surgical history and problem list.  ? ?Health Maintenance:   ?Diagnosis  ?Date Value Ref Range Status  ?11/09/2020   Final  ? - Negative for intraepithelial lesion or malignancy (NILM)  ?  ? ?Normal mammogram on 05/2021.  ? ?Review of Systems:  ?Pertinent items noted in HPI and remainder of comprehensive ROS otherwise negative. ? ?Physical Exam:  ?There were no vitals taken for this visit. ?CONSTITUTIONAL: Well-developed, well-nourished female in no acute distress.  ?HEENT:   Normocephalic, atraumatic. External right and left ear normal. No scleral icterus.  ?NECK: Normal range of motion, supple, no masses noted on observation ?SKIN: No rash noted. Not diaphoretic. No erythema. No pallor. ?MUSCULOSKELETAL: Normal range of motion. No edema noted. ?NEUROLOGIC: Alert and oriented to person, place, and time. Normal muscle tone coordination. No cranial nerve deficit noted. ?PSYCHIATRIC: Normal mood and affect. Normal behavior. Normal judgment and thought content. ? ?CARDIOVASCULAR: Normal heart rate noted ?RESPIRATORY: Effort and breath sounds normal, no problems with respiration noted ?ABDOMEN: No masses noted. No other overt distention noted.   ? ?PELVIC: Deferred ? ?Labs and Imaging ?No results found for this or any previous visit (from the past 168 hour(s)). ?No results found.  ?Assessment and Plan:  ? 1. Postop check ?- No restrictions ?- Pathology reviewed ?- If PMB again, she will contact us ?- Would do paps Q3-5 yrs depending on ability to have medicare cover HPV testing but this can be all done as before with Dr. Madilyn Fireman.  ? ?Routine preventative health maintenance measures emphasized. ?Please refer to After Visit Summary for other counseling recommendations.  ? ?No follow-ups on file. ? ?Radene Gunning, MD, FACOG ?Obstetrician Social research officer, government, Faculty Practice ?[facility] ? ? ? ? ? ?

## 2022-02-02 ENCOUNTER — Encounter: Payer: Self-pay | Admitting: Obstetrics and Gynecology

## 2022-02-02 ENCOUNTER — Ambulatory Visit (INDEPENDENT_AMBULATORY_CARE_PROVIDER_SITE_OTHER): Payer: Medicare Other | Admitting: Obstetrics and Gynecology

## 2022-02-02 VITALS — BP 144/78 | HR 75 | Ht 65.0 in | Wt 169.0 lb

## 2022-02-02 DIAGNOSIS — Z09 Encounter for follow-up examination after completed treatment for conditions other than malignant neoplasm: Secondary | ICD-10-CM

## 2022-02-05 ENCOUNTER — Other Ambulatory Visit: Payer: Self-pay | Admitting: Family Medicine

## 2022-02-05 DIAGNOSIS — F411 Generalized anxiety disorder: Secondary | ICD-10-CM

## 2022-02-13 ENCOUNTER — Other Ambulatory Visit: Payer: Self-pay | Admitting: Family Medicine

## 2022-02-13 ENCOUNTER — Other Ambulatory Visit: Payer: Self-pay

## 2022-02-13 DIAGNOSIS — F411 Generalized anxiety disorder: Secondary | ICD-10-CM

## 2022-02-13 MED ORDER — SERTRALINE HCL 50 MG PO TABS: 50.0000 mg | ORAL_TABLET | Freq: Every day | ORAL | 3 refills | Status: DC

## 2022-02-23 ENCOUNTER — Telehealth: Payer: Medicare Other | Admitting: Emergency Medicine

## 2022-02-23 DIAGNOSIS — H1033 Unspecified acute conjunctivitis, bilateral: Secondary | ICD-10-CM | POA: Diagnosis not present

## 2022-02-23 MED ORDER — POLYMYXIN B-TRIMETHOPRIM 10000-0.1 UNIT/ML-% OP SOLN: 1.0000 [drp] | OPHTHALMIC | 0 refills | Status: DC

## 2022-02-23 NOTE — Progress Notes (Signed)
Virtual Visit Consent   Valerie Carroll, you are scheduled for a virtual visit with a [facility] provider today. Just as with appointments in the office, your consent must be obtained to participate. Your consent will be active for this visit and any virtual visit you may have with one of our providers in the next 365 days. If you have a MyChart account, a copy of this consent can be sent to you electronically.  As this is a virtual visit, video technology does not allow for your provider to perform a traditional examination. This may limit your provider's ability to fully assess your condition. If your provider identifies any concerns that need to be evaluated in person or the need to arrange testing (such as labs, EKG, etc.), we will make arrangements to do so. Although advances in technology are sophisticated, we cannot ensure that it will always work on either your end or our end. If the connection with a video visit is poor, the visit may have to be switched to a telephone visit. With either a video or telephone visit, we are not always able to ensure that we have a secure connection.  By engaging in this virtual visit, you consent to the provision of healthcare and authorize for your insurance to be billed (if applicable) for the services provided during this visit. Depending on your insurance coverage, you may receive a charge related to this service.  I need to obtain your verbal consent now. Are you willing to proceed with your visit today? Valerie Carroll has provided verbal consent on 02/23/2022 for a virtual visit (video or telephone). Carvel Getting, NP  Date: 02/23/2022 11:03 AM  Virtual Visit via Video Note   I, Carvel Getting, connected with  Valerie Carroll  ([id number], 1956/06/28) on 02/23/22 at 11:00 AM EDT by a video-enabled telemedicine application and verified that I am speaking with the correct person using two identifiers.  Location: Patient: Virtual Visit Location Patient:  Home Provider: Virtual Visit Location Provider: Home Office   I discussed the limitations of evaluation and management by telemedicine and the availability of in person appointments. The patient expressed understanding and agreed to proceed.    History of Present Illness: Valerie Carroll is a 66 y.o. who identifies as a female who was assigned female at birth, and is being seen today for pinkeye.  She reports a recent visit to see her daughter and her grandchildren in [place].  Her grandchildren had pinkeye.  She believes she now has pinkeye.  She has had bilateral red eyes with yellow drainage for the last day.  She does wear contacts but has not worn them since she developed eye symptoms.  She has been using warm compresses and Restasis to treat her symptoms with no relief.  She denies any other symptoms including fever or chills.  She denies itchy eyes.  She does report mild nasal allergy symptoms that she experiences typically when she visits her daughter in [place].  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 11/10/2020   Lumbago 12/26/2016   Myofascial pain syndrome 07/26/2012   Cervical radiculopathy at C8 06/26/2012   Hyperlipidemia 03/30/2010   INSOMNIA 06/04/2009   IFG (impaired fasting glucose) 07/16/2008   FATIGUE 07/14/2008  POSTMENOPAUSAL STATUS 07/14/2008   Essential hypertension, benign 08/22/2007   Generalized anxiety disorder 07/17/2006   TOBACCO DEPENDENCE 07/17/2006    Allergies:  Allergies  Allergen Reactions   Citalopram Hydrobromide Nausea Only   Effexor [Venlafaxine] Other (See Comments)    Brain zaps, and feeling mentally sluggish   Lisinopril Cough   Medications:  Current Outpatient Medications:     trimethoprim-polymyxin b (POLYTRIM) ophthalmic solution, Place 1 drop into both eyes every 4 (four) hours., Disp: 10 mL, Rfl: 0   ALPRAZolam (XANAX) 0.5 MG tablet, TAKE 1/2 TO 1 TABLET BY MOUTH TWO TIMES A DAY AS NEEDED FOR ANXIETY.  USE SPARINGLY., Disp: 60 tablet, Rfl: 0   aspirin EC 81 MG tablet, Take 81 mg by mouth daily. Swallow whole., Disp: , Rfl:    ibuprofen (ADVIL) 800 MG tablet, Take 1 tablet (800 mg total) by mouth 3 (three) times daily with meals as needed for headache, moderate pain or cramping., Disp: 60 tablet, Rfl: 0   losartan-hydrochlorothiazide (HYZAAR) 50-12.5 MG tablet, TAKE ONE TABLET BY MOUTH DAILY, Disp: 90 tablet, Rfl: 1   sertraline (ZOLOFT) 50 MG tablet, Take 1 tablet (50 mg total) by mouth daily., Disp: 30 tablet, Rfl: 3   simvastatin (ZOCOR) 40 MG tablet, TAKE ONE TABLET BY MOUTH EVERY EVENING AT 6 (Patient taking differently: TAKE ONE TABLET BY MOUTH EVERY EVENING AT 6), Disp: 90 tablet, Rfl: 2  Observations/Objective: Patient is well-developed, well-nourished in no acute distress.  Resting comfortably  at home.  Head is normocephalic, atraumatic.  No labored breathing.  Speech is clear and coherent with logical content.  Patient is alert and oriented at baseline.    Assessment and Plan: 1. Acute bacterial conjunctivitis of both eyes  Likely bacterial conjunctivitis given exposure to grandchildren.  Reviewed supportive care measures.  Prescribed Polytrim.  Encourage patient to test her self for COVID.  Follow Up Instructions: I discussed the assessment and treatment plan with the patient. The patient was provided an opportunity to ask questions and all were answered. The patient agreed with the plan and demonstrated an understanding of the instructions.  A copy of instructions were sent to the patient via MyChart unless otherwise noted below.   The patient was advised to call back or seek an in-person evaluation if the symptoms worsen or if the condition fails  to improve as anticipated.  Time:  I spent 8 minutes with the patient via telehealth technology discussing the above problems/concerns.    Carvel Getting, NP

## 2022-02-23 NOTE — Patient Instructions (Signed)
  Elwin Mocha, thank you for joining Carvel Getting, NP for today's virtual visit.  While this provider is not your primary care provider (PCP), if your PCP is located in our provider database this encounter information will be shared with them immediately following your visit.  Consent: (Patient) Valerie Carroll provided verbal consent for this virtual visit at the beginning of the encounter.  Current Medications:  Current Outpatient Medications:    trimethoprim-polymyxin b (POLYTRIM) ophthalmic solution, Place 1 drop into both eyes every 4 (four) hours., Disp: 10 mL, Rfl: 0   ALPRAZolam (XANAX) 0.5 MG tablet, TAKE 1/2 TO 1 TABLET BY MOUTH TWO TIMES A DAY AS NEEDED FOR ANXIETY.  USE SPARINGLY., Disp: 60 tablet, Rfl: 0   aspirin EC 81 MG tablet, Take 81 mg by mouth daily. Swallow whole., Disp: , Rfl:    ibuprofen (ADVIL) 800 MG tablet, Take 1 tablet (800 mg total) by mouth 3 (three) times daily with meals as needed for headache, moderate pain or cramping., Disp: 60 tablet, Rfl: 0   losartan-hydrochlorothiazide (HYZAAR) 50-12.5 MG tablet, TAKE ONE TABLET BY MOUTH DAILY, Disp: 90 tablet, Rfl: 1   sertraline (ZOLOFT) 50 MG tablet, Take 1 tablet (50 mg total) by mouth daily., Disp: 30 tablet, Rfl: 3   simvastatin (ZOCOR) 40 MG tablet, TAKE ONE TABLET BY MOUTH EVERY EVENING AT 6 (Patient taking differently: TAKE ONE TABLET BY MOUTH EVERY EVENING AT 6), Disp: 90 tablet, Rfl: 2   Medications ordered in this encounter:  Meds ordered this encounter  Medications   trimethoprim-polymyxin b (POLYTRIM) ophthalmic solution    Sig: Place 1 drop into both eyes every 4 (four) hours.    Dispense:  10 mL    Refill:  0     *If you need refills on other medications prior to your next appointment, please contact your pharmacy*  Follow-Up: Call back or seek an in-person evaluation if the symptoms worsen or if the condition fails to improve as anticipated.  Other Instructions Use warm compresses several times  a day as you are able with clean washcloth each time.  This can help relieve the infection.  You may consider testing yourself for COVID as COVID is now causing pinkeye symptoms in some people.  Do not wear your contacts until this infection is completely better and before you return to using contacts make sure your contacts have been cleaned carefully   If you have been instructed to have an in-person evaluation today at a local Urgent Care facility, please use the link below. It will take you to a list of all of our available [facility], including address, phone number and hours of operation. Please do not delay care.  [facility]  If you or a family member do not have a primary care provider, use the link below to schedule a visit and establish care. When you choose a [facility] primary care physician or advanced practice provider, you gain a long-term partner in health. Find a Primary Care Provider  Learn more about [facility]'s in-office and virtual care options: [facility]

## 2022-02-27 ENCOUNTER — Telehealth: Payer: Medicare Other | Admitting: Family Medicine

## 2022-02-27 DIAGNOSIS — J019 Acute sinusitis, unspecified: Secondary | ICD-10-CM | POA: Diagnosis not present

## 2022-02-27 DIAGNOSIS — B9689 Other specified bacterial agents as the cause of diseases classified elsewhere: Secondary | ICD-10-CM | POA: Diagnosis not present

## 2022-02-27 MED ORDER — AMOXICILLIN-POT CLAVULANATE 875-125 MG PO TABS: 1.0000 | ORAL_TABLET | Freq: Two times a day (BID) | ORAL | 0 refills | Status: AC

## 2022-02-27 NOTE — Patient Instructions (Signed)

## 2022-02-27 NOTE — Progress Notes (Signed)
Virtual Visit Consent   Valerie Carroll, you are scheduled for a virtual visit with a [facility] provider today. Just as with appointments in the office, your consent must be obtained to participate. Your consent will be active for this visit and any virtual visit you may have with one of our providers in the next 365 days. If you have a MyChart account, a copy of this consent can be sent to you electronically.  As this is a virtual visit, video technology does not allow for your provider to perform a traditional examination. This may limit your provider's ability to fully assess your condition. If your provider identifies any concerns that need to be evaluated in person or the need to arrange testing (such as labs, EKG, etc.), we will make arrangements to do so. Although advances in technology are sophisticated, we cannot ensure that it will always work on either your end or our end. If the connection with a video visit is poor, the visit may have to be switched to a telephone visit. With either a video or telephone visit, we are not always able to ensure that we have a secure connection.  By engaging in this virtual visit, you consent to the provision of healthcare and authorize for your insurance to be billed (if applicable) for the services provided during this visit. Depending on your insurance coverage, you may receive a charge related to this service.  I need to obtain your verbal consent now. Are you willing to proceed with your visit today? Valerie Carroll has provided verbal consent on 02/27/2022 for a virtual visit (video or telephone). Perlie Mayo, NP  Date: 02/27/2022 1:32 PM  Virtual Visit via Video Note   I, Perlie Mayo, connected with  Valerie Carroll  ([id number], [birth year]) on 02/27/22 at  1:30 PM EDT by a video-enabled telemedicine application and verified that I am speaking with the correct person using two identifiers.  Location: Patient: Virtual Visit Location Patient:  Home Provider: Virtual Visit Location Provider: Home Office   I discussed the limitations of evaluation and management by telemedicine and the availability of in person appointments. The patient expressed understanding and agreed to proceed.    History of Present Illness: Valerie Carroll is a 66 y.o. who identifies as a female who was assigned female at birth, and is being seen today for sinus symptoms.  Of note was seen on 5/18 for pink eye.  HPI: Sinus Problem This is a new problem. The current episode started in the past 7 days. The problem has been gradually worsening since onset. There has been no fever. The pain is moderate. Associated symptoms include congestion, headaches, sinus pressure, sneezing, a sore throat and swollen glands. Pertinent negatives include no chills, coughing, diaphoresis, ear pain, hoarse voice, neck pain or shortness of breath. Treatments tried: claritin and Advil - without much relief. The treatment provided no relief.   Problems:  Patient Active Problem List   Diagnosis Date Noted   Endocervical polyp    Endometrial polyp    Postmenopausal bleeding 12/25/2021   Osteopenia after menopause 08/04/2021   Impingement syndrome, shoulder, right 03/01/2021   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 11/10/2020   Lumbago 12/26/2016   Myofascial pain syndrome 07/26/2012   Cervical radiculopathy at C8 06/26/2012   Hyperlipidemia 03/30/2010   INSOMNIA 06/04/2009   IFG (impaired fasting glucose) 07/16/2008   FATIGUE 07/14/2008   POSTMENOPAUSAL STATUS 07/14/2008   Essential hypertension, benign 08/22/2007   Generalized anxiety disorder 07/17/2006  TOBACCO DEPENDENCE 07/17/2006    Allergies:  Allergies  Allergen Reactions   Citalopram Hydrobromide Nausea Only   Effexor [Venlafaxine] Other (See Comments)    Brain zaps, and feeling mentally sluggish   Lisinopril Cough   Medications:  Current Outpatient Medications:    ALPRAZolam (XANAX) 0.5 MG tablet, TAKE 1/2 TO 1  TABLET BY MOUTH TWO TIMES A DAY AS NEEDED FOR ANXIETY.  USE SPARINGLY., Disp: 60 tablet, Rfl: 0   aspirin EC 81 MG tablet, Take 81 mg by mouth daily. Swallow whole., Disp: , Rfl:    ibuprofen (ADVIL) 800 MG tablet, Take 1 tablet (800 mg total) by mouth 3 (three) times daily with meals as needed for headache, moderate pain or cramping., Disp: 60 tablet, Rfl: 0   losartan-hydrochlorothiazide (HYZAAR) 50-12.5 MG tablet, TAKE ONE TABLET BY MOUTH DAILY, Disp: 90 tablet, Rfl: 1   sertraline (ZOLOFT) 50 MG tablet, Take 1 tablet (50 mg total) by mouth daily., Disp: 30 tablet, Rfl: 3   simvastatin (ZOCOR) 40 MG tablet, TAKE ONE TABLET BY MOUTH EVERY EVENING AT 6 (Patient taking differently: TAKE ONE TABLET BY MOUTH EVERY EVENING AT 6), Disp: 90 tablet, Rfl: 2   trimethoprim-polymyxin b (POLYTRIM) ophthalmic solution, Place 1 drop into both eyes every 4 (four) hours., Disp: 10 mL, Rfl: 0  Observations/Objective: Patient is well-developed, well-nourished in no acute distress.  Resting comfortably  at home.  Head is normocephalic, atraumatic.  No labored breathing.  Speech is clear and coherent with logical content.  Patient is alert and oriented at baseline.    Assessment and Plan: 1. Acute bacterial sinusitis  - amoxicillin-clavulanate (AUGMENTIN) 875-125 MG tablet; Take 1 tablet by mouth 2 (two) times daily for 7 days.  Dispense: 14 tablet; Refill: 0  S&S are consistent with viral turned sinus infection -Augmentin for Tx -OTC options reviewed  -F/u with PCP if not improved    Reviewed side effects, risks and benefits of medication.    Patient acknowledged agreement and understanding of the plan.     Follow Up Instructions: I discussed the assessment and treatment plan with the patient. The patient was provided an opportunity to ask questions and all were answered. The patient agreed with the plan and demonstrated an understanding of the instructions.  A copy of instructions were sent to the  patient via MyChart unless otherwise noted below.    The patient was advised to call back or seek an in-person evaluation if the symptoms worsen or if the condition fails to improve as anticipated.  Time:  I spent 15 minutes with the patient via telehealth technology discussing the above problems/concerns.    Perlie Mayo, NP

## 2022-03-07 ENCOUNTER — Other Ambulatory Visit: Payer: Self-pay

## 2022-03-07 DIAGNOSIS — Z122 Encounter for screening for malignant neoplasm of respiratory organs: Secondary | ICD-10-CM

## 2022-03-07 DIAGNOSIS — Z87891 Personal history of nicotine dependence: Secondary | ICD-10-CM

## 2022-03-07 DIAGNOSIS — F17210 Nicotine dependence, cigarettes, uncomplicated: Secondary | ICD-10-CM

## 2022-03-17 ENCOUNTER — Encounter: Payer: Self-pay | Admitting: Family Medicine

## 2022-03-17 ENCOUNTER — Ambulatory Visit (INDEPENDENT_AMBULATORY_CARE_PROVIDER_SITE_OTHER): Payer: Medicare Other | Admitting: Family Medicine

## 2022-03-17 VITALS — BP 136/60 | HR 70 | Resp 16 | Ht 65.0 in | Wt 169.0 lb

## 2022-03-17 DIAGNOSIS — F411 Generalized anxiety disorder: Secondary | ICD-10-CM | POA: Diagnosis not present

## 2022-03-17 DIAGNOSIS — J019 Acute sinusitis, unspecified: Secondary | ICD-10-CM

## 2022-03-17 MED ORDER — DOXYCYCLINE HYCLATE 100 MG PO TABS: 100.0000 mg | ORAL_TABLET | Freq: Two times a day (BID) | ORAL | 0 refills | Status: DC

## 2022-03-17 MED ORDER — ALPRAZOLAM 0.5 MG PO TABS: ORAL_TABLET | ORAL | 0 refills | Status: DC

## 2022-03-17 NOTE — Patient Instructions (Signed)
Okay to decrease sertraline to half a tab daily for 6 days and then stop the very next day you can go ahead and start the Lexapro 10 mg daily.  Elizebeth Koller out what you have and then okay to increase back to 20 mg.

## 2022-03-17 NOTE — Progress Notes (Signed)
Established Patient Office Visit  Subjective   Patient ID: Valerie Carroll, female    DOB: 1955-10-14  Age: 67 y.o. MRN: [id number]  Chief Complaint  Patient presents with   Depression    Anxiety. Patient stated she does not like Zoloft. Patient states she has been having brain zaps on the Zoloft. Patient would like to go back on Lexapro 20 mg.     Sinusitis    Facial pressure pain, sore throat, postnasal drainage, coughing at night since 02/27/22. Patient was treated with Augmentin but still having symptoms.     HPI  Here today for 56-monthfollow-up for mood.  We tapered her off the Lexapro and switched her to sertraline.  She is currently taking 50 mg.  Anxiety. Patient stated she does not like Zoloft. Patient states she has been having brain zaps on the Zoloft. Patient would like to go back on Lexapro 20 mg.  She has been not feeling well for several weeks at this point.  She went to visit family and unfortunately contracted conjunctivitis.  She was given drops for that but then several days later started to develop significant congestion and sinus symptoms.  Facial pressure pain, sore throat, postnasal drainage, coughing at night since 02/27/22. Patient was treated with Augmentin but still having symptoms.  Unfortunately she really does not feel any better.  She says she really did not feel better while she was on the antibiotics either.  She has been using an over-the-counter cold medication and says she does get temporary relief with that.    ROS    Objective:     BP 136/60   Pulse 70   Resp 16   Ht '5\' 5"'$  (1.651 m)   Wt 169 lb (76.7 kg)   SpO2 97%   BMI 28.12 kg/m    Physical Exam Vitals and nursing note reviewed.  Constitutional:      Appearance: She is well-developed.  HENT:     Head: Normocephalic and atraumatic.     Right Ear: Tympanic membrane, ear canal and external ear normal.     Left Ear: Tympanic membrane, ear canal and external ear normal.     Nose: Nose  normal.  Eyes:     Conjunctiva/sclera: Conjunctivae normal.     Pupils: Pupils are equal, round, and reactive to light.  Neck:     Thyroid: No thyromegaly.  Cardiovascular:     Rate and Rhythm: Normal rate and regular rhythm.     Heart sounds: Normal heart sounds.  Pulmonary:     Effort: Pulmonary effort is normal.     Breath sounds: Normal breath sounds. No wheezing.  Musculoskeletal:     Cervical back: Neck supple.  Lymphadenopathy:     Cervical: No cervical adenopathy.  Skin:    General: Skin is warm and dry.  Neurological:     Mental Status: She is alert and oriented to person, place, and time.  Psychiatric:        Behavior: Behavior normal.     No results found for any visits on 03/17/22.    The 10-year ASCVD risk score (Arnett DK, et al., 2019) is: 16.1%    Assessment & Plan:   Problem List Items Addressed This Visit       Other   Generalized anxiety disorder - Primary    We discussed options.  She really just wants to go back on her Lexapro.  She has tried a couple other agents as well and does not  want to do anything new at this point.  We will taper back on the sertraline and start Lexapro.  She was previously on 20 mg.  New prescription sent to pharmacy.  Follow-up in 2 to 3 months.  He requested a refill on there presently and she has been using a few more with the transition of medications.  Just reminded her to make sure that she is using it very sparingly.  Last filled 2 months ago.  I do not want her using it daily.      Relevant Medications   ALPRAZolam (XANAX) 0.5 MG tablet   Other Visit Diagnoses     Acute non-recurrent sinusitis, unspecified location       Relevant Medications   doxycycline (VIBRA-TABS) 100 MG tablet      Acute sinusitis - will tx with doxy. Call if not better in one week.   Return in about 3 months (around 06/17/2022) for Mood.    Beatrice Lecher, MD

## 2022-03-17 NOTE — Assessment & Plan Note (Addendum)
We discussed options.  She really just wants to go back on her Lexapro.  She has tried a couple other agents as well and does not want to do anything new at this point.  We will taper back on the sertraline and start Lexapro.  She was previously on 20 mg.  New prescription sent to pharmacy.  Follow-up in 2 to 3 months.  He requested a refill on there presently and she has been using a few more with the transition of medications.  Just reminded her to make sure that she is using it very sparingly.  Last filled 2 months ago.  I do not want her using it daily.

## 2022-03-21 ENCOUNTER — Encounter: Payer: Self-pay | Admitting: Acute Care

## 2022-03-21 ENCOUNTER — Ambulatory Visit (INDEPENDENT_AMBULATORY_CARE_PROVIDER_SITE_OTHER): Payer: Medicare Other | Admitting: Acute Care

## 2022-03-21 DIAGNOSIS — F17210 Nicotine dependence, cigarettes, uncomplicated: Secondary | ICD-10-CM | POA: Diagnosis not present

## 2022-03-21 NOTE — Progress Notes (Signed)
Virtual Visit via Telephone Note  I connected with Valerie Carroll on 03/14/22 at  3:30 PM EDT by telephone and verified that I am speaking with the correct person using two identifiers.  Location: Patient:  At home Provider:  [address], [place], [place], [address]    I discussed the limitations, risks, security and privacy concerns of performing an evaluation and management service by telephone and the availability of in person appointments. I also discussed with the patient that there may be a patient responsible charge related to this service. The patient expressed understanding and agreed to proceed.   Shared Decision Making Visit Lung Cancer Screening Program [phone])   Eligibility: Age 66 y.o. Pack Years Smoking History Calculation 55 pack year smoking history (# packs/per year x # years smoked) Recent History of coughing up blood  no Unexplained weight loss? no ( >Than 15 pounds within the last 6 months ) Prior History Lung / other cancer no (Diagnosis within the last 5 years already requiring surveillance chest CT Scans). Smoking Status Current Smoker Former Smokers: Years since quit:  NA  Quit Date:  NA  Visit Components: Discussion included one or more decision making aids. yes Discussion included risk/benefits of screening. yes Discussion included potential follow up diagnostic testing for abnormal scans. yes Discussion included meaning and risk of over diagnosis. yes Discussion included meaning and risk of False Positives. yes Discussion included meaning of total radiation exposure. yes  Counseling Included: Importance of adherence to annual lung cancer LDCT screening. yes Impact of comorbidities on ability to participate in the program. yes Ability and willingness to under diagnostic treatment. yes  Smoking Cessation Counseling: Current Smokers:  Discussed importance of smoking cessation. yes Information about tobacco cessation classes and  interventions provided to patient. yes Patient provided with "ticket" for LDCT Scan. yes Symptomatic Patient. no  Counseling NA Diagnosis Code: Tobacco Use Z72.0 Asymptomatic Patient yes  Counseling (Intermediate counseling: > three minutes counseling) E3329 Former Smokers:  Discussed the importance of maintaining cigarette abstinence. yes Diagnosis Code: Personal History of Nicotine Dependence. J18.841 Information about tobacco cessation classes and interventions provided to patient. Yes Patient provided with "ticket" for LDCT Scan. yes Written Order for Lung Cancer Screening with LDCT placed in Epic. Yes (CT Chest Lung Cancer Screening Low Dose W/O CM) [id number] Z12.2-Screening of respiratory organs Z87.891-Personal history of nicotine dependence  I have spent 25 minutes of face to face/ virtual visit   time with  [name] discussing the risks and benefits of lung cancer screening. We viewed / discussed a power point together that explained in detail the above noted topics. We paused at intervals to allow for questions to be asked and answered to ensure understanding.We discussed that the single most powerful action that she can take to decrease her risk of developing lung cancer is to quit smoking. We discussed whether or not she is ready to commit to setting a quit date. We discussed options for tools to aid in quitting smoking including nicotine replacement therapy, non-nicotine medications, support groups, Quit Smart classes, and behavior modification. We discussed that often times setting smaller, more achievable goals, such as eliminating 1 cigarette a day for a week and then 2 cigarettes a day for a week can be helpful in slowly decreasing the number of cigarettes smoked. This allows for a sense of accomplishment as well as providing a clinical benefit. I provided  her  with smoking cessation  information  with contact information for community resources,  classes, free nicotine replacement  therapy, and access to mobile apps, text messaging, and on-line smoking cessation help. I have also provided  her  the office contact information in the event she needs to contact me, or the screening staff. We discussed the time and location of the scan, and that either Doroteo Glassman RN, Joella Prince, RN  or I will call / send a letter with the results within 24-72 hours of receiving them. The patient verbalized understanding of all of  the above and had no further questions upon leaving the office. They have my contact information in the event they have any further questions.  I spent 3 minutes counseling on smoking cessation and the health risks of continued tobacco abuse.  I explained to the patient that there has been a high incidence of coronary artery disease noted on these exams. I explained that this is a non-gated exam therefore degree or severity cannot be determined. This patient is on statin therapy. I have asked the patient to follow-up with their PCP regarding any incidental finding of coronary artery disease and management with diet or medication as their PCP  feels is clinically indicated. The patient verbalized understanding of the above and had no further questions upon completion of the visit.      Magdalen Spatz, NP 03/21/2022

## 2022-03-21 NOTE — Patient Instructions (Signed)
Thank you for participating in the [facility] Scre[facility]. It was our pleasure to meet you today. We will call you with the results of your scan within the next few days. Your scan will be assigned a Lung RADS category score by the physicians reading the scans.  This Lung RADS score determines follow up scanning.  See below for description of categories, and follow up screening recommendations. We will be in touch to schedule your follow up screening annually or based on recommendations of our providers. We will fax a copy of your scan results to your Primary Care Physician, or the physician who referred you to the program, to ensure they have the results. Please call the office if you have any questions or concerns regarding your scanning experience or results.  Our office number is [phone]. Please speak with Denise Phelps, RN. , or  Denise Buckner RN, They are  our Lung Cancer Screening RN.'s If They are unavailable when you call, Please leave a message on the voice mail. We will return your call at our earliest convenience.This voice mail is monitored several times a day.  Remember, if your scan is normal, we will scan you annually as long as you continue to meet the criteria for the program. (Age 55-77, Current smoker or smoker who has quit within the last 15 years). If you are a smoker, remember, quitting is the single most powerful action that you can take to decrease your risk of lung cancer and other pulmonary, breathing related problems. We know quitting is hard, and we are here to help.  Please let us know if there is anything we can do to help you meet your goal of quitting. If you are a former smoker, congratulations. We are proud of you! Remain smoke free! Remember you can refer friends or family members through the number above.  We will screen them to make sure they meet criteria for the program. Thank you for helping us take better care of you by  participating in Lung Screening.  You can receive free nicotine replacement therapy ( patches, gum or mints) by calling 1-800-QUIT NOW. Please call so we can get you on the path to becoming  a non-smoker. I know it is hard, but you can do this!  Lung RADS Categories:  Lung RADS 1: no nodules or definitely non-concerning nodules.  Recommendation is for a repeat annual scan in 12 months.  Lung RADS 2:  nodules that are non-concerning in appearance and behavior with a very low likelihood of becoming an active cancer. Recommendation is for a repeat annual scan in 12 months.  Lung RADS 3: nodules that are probably non-concerning , includes nodules with a low likelihood of becoming an active cancer.  Recommendation is for a 6-month repeat screening scan. Often noted after an upper respiratory illness. We will be in touch to make sure you have no questions, and to schedule your 6-month scan.  Lung RADS 4 A: nodules with concerning findings, recommendation is most often for a follow up scan in 3 months or additional testing based on our provider's assessment of the scan. We will be in touch to make sure you have no questions and to schedule the recommended 3 month follow up scan.  Lung RADS 4 B:  indicates findings that are concerning. We will be in touch with you to schedule additional diagnostic testing based on our provider's  assessment of the scan.  Other options for assistance in smoking cessation (   As covered by your insurance benefits)  Hypnosis for smoking cessation  [employer] [phone]  Acupuncture for smoking cessation  [facility] [phone]   

## 2022-03-22 ENCOUNTER — Ambulatory Visit (INDEPENDENT_AMBULATORY_CARE_PROVIDER_SITE_OTHER): Payer: Medicare Other

## 2022-03-22 DIAGNOSIS — F17210 Nicotine dependence, cigarettes, uncomplicated: Secondary | ICD-10-CM

## 2022-03-22 DIAGNOSIS — Z122 Encounter for screening for malignant neoplasm of respiratory organs: Secondary | ICD-10-CM

## 2022-03-22 DIAGNOSIS — Z87891 Personal history of nicotine dependence: Secondary | ICD-10-CM

## 2022-03-24 ENCOUNTER — Other Ambulatory Visit: Payer: Self-pay

## 2022-03-24 DIAGNOSIS — Z122 Encounter for screening for malignant neoplasm of respiratory organs: Secondary | ICD-10-CM

## 2022-03-24 DIAGNOSIS — Z87891 Personal history of nicotine dependence: Secondary | ICD-10-CM

## 2022-03-24 DIAGNOSIS — F17210 Nicotine dependence, cigarettes, uncomplicated: Secondary | ICD-10-CM

## 2022-04-14 ENCOUNTER — Other Ambulatory Visit: Payer: Self-pay | Admitting: Family Medicine

## 2022-04-14 DIAGNOSIS — F411 Generalized anxiety disorder: Secondary | ICD-10-CM

## 2022-04-26 ENCOUNTER — Ambulatory Visit (INDEPENDENT_AMBULATORY_CARE_PROVIDER_SITE_OTHER): Payer: Medicare Other | Admitting: Sports Medicine

## 2022-04-26 ENCOUNTER — Ambulatory Visit (INDEPENDENT_AMBULATORY_CARE_PROVIDER_SITE_OTHER): Payer: Medicare Other

## 2022-04-26 DIAGNOSIS — M19011 Primary osteoarthritis, right shoulder: Secondary | ICD-10-CM

## 2022-04-26 NOTE — Progress Notes (Signed)
    Procedures performed today:    Procedure: Real-time Ultrasound Guided injection of the right glenohumeral joint Device: Samsung [id number]  Verbal informed consent obtained.  Time-out conducted.  Noted no overlying erythema, induration, or other signs of local infection.  Skin prepped in a sterile fashion.  Local anesthesia: Topical Ethyl chloride.  With sterile technique and under real time ultrasound guidance: Arthritic joint noted, 1 cc Kenalog 40, 2 cc lidocaine, 2 cc bupivacaine injected easily Completed without difficulty  Advised to call if fevers/chills, erythema, induration, drainage, or persistent bleeding.  Images permanently stored and available for review in PACS.  Impression: Technically successful ultrasound guided injection.  Independent interpretation of notes and tests performed by another provider:   None.  Brief History, Exam, Impression, and Recommendations:    Primary osteoarthritis of right shoulder Pleasant 66 year old female, chronic right shoulder pain with glenohumeral osteoarthritis on x-rays, last injected in February of this year, now having recurrence of pain, pain seems to be referable to the glenohumeral joint, repeat glenohumeral joint injection today, return to see me as needed. Continue home conditioning.    ____________________________________________ Gwen Her. Dianah Field, M.D., ABFM., CAQSM., AME. Primary Care and Sports Medicine [facility]  Adjunct Professor of [employer] of Medicine  Risk manager

## 2022-04-26 NOTE — Assessment & Plan Note (Signed)
Pleasant 66 year old female, chronic right shoulder pain with glenohumeral osteoarthritis on x-rays, last injected in February of this year, now having recurrence of pain, pain seems to be referable to the glenohumeral joint, repeat glenohumeral joint injection today, return to see me as needed. Continue home conditioning.

## 2022-05-23 ENCOUNTER — Ambulatory Visit
Admission: RE | Admit: 2022-05-23 | Discharge: 2022-05-23 | Disposition: A | Discharge status: Home / Self Care | Payer: Medicare Other | Source: Ambulatory Visit | Attending: Family Medicine | Admitting: Family Medicine

## 2022-05-23 ENCOUNTER — Ambulatory Visit (INDEPENDENT_AMBULATORY_CARE_PROVIDER_SITE_OTHER): Payer: Medicare Other

## 2022-05-23 VITALS — BP 103/69 | HR 94 | Temp 98.3°F | Resp 18

## 2022-05-23 DIAGNOSIS — R053 Chronic cough: Secondary | ICD-10-CM | POA: Diagnosis not present

## 2022-05-23 DIAGNOSIS — R059 Cough, unspecified: Secondary | ICD-10-CM | POA: Diagnosis not present

## 2022-05-23 MED ORDER — AZITHROMYCIN 250 MG PO TABS: ORAL_TABLET | ORAL | 0 refills | Status: DC

## 2022-05-23 MED ORDER — GUAIFENESIN-CODEINE 100-10 MG/5ML PO SOLN: ORAL | 0 refills | Status: DC

## 2022-05-23 MED ORDER — METHYLPREDNISOLONE ACETATE 80 MG/ML IJ SUSP
80.0000 mg | Freq: Once | INTRAMUSCULAR | Status: AC
  Administered 2022-05-23: 80 mg via INTRAMUSCULAR

## 2022-05-23 NOTE — Discharge Instructions (Signed)
Stay well hydrated and get adequate rest.   For sinus congestion may use Afrin nasal spray (or generic oxymetazoline) each morning for about 5 days and then discontinue.  Also recommend using saline nasal spray several times daily and saline nasal irrigation (AYR is a common brand).  Use Flonase nasal spray each morning after using Afrin nasal spray and saline nasal irrigation. Try warm salt water gargles for sore throat.  If symptoms become significantly worse during the night or over the weekend, proceed to the local emergency room.

## 2022-05-23 NOTE — ED Triage Notes (Signed)
Patient c/o non-productive cough, nasal congestion, allergies, trouble breathing on and off for several weeks.  Patient has taken several different OTC meds.

## 2022-05-23 NOTE — ED Provider Notes (Signed)
Valerie Carroll CARE    CSN: [id number] Arrival date & time: 05/23/22  1417      History   Chief Complaint Chief Complaint  Patient presents with   Cough    Fighting a sinus infection since June.Now a bad cough making it hard to breathe. - Entered by patient    HPI Valerie Carroll is a 66 y.o. female.   Patient complains of a persistent cough for the past several weeks.  The cough was initially non-productive and now productive worse at night.  She wheezes occasionally, feeling mildly short of breath, but denies pleuritic pain.  She denies fever but admits that she has had chills during the past week. She continues to smoke. She was treated for sinusitis in June. Review of chart records reveals a CT chest CA screen 03/24/22 that suggested underlying COPD.  The history is provided by the patient.    Past Medical History:  Diagnosis Date   Arthritis    right shoulder   GAD (generalized anxiety disorder)    History of basal cell carcinoma (BCC) excision    History of melanoma excision 11/2020   s/p  MOH's surgery on nose , localized   Hyperlipidemia    Hypertension    followed by pcp   (hx evaluated by cardiology for pre-corial pain and abnormal ekg by dr Stanford Breed,  normal CCTA 01-21-2021 no disease)   PMB (postmenopausal bleeding)    Pre-diabetes    RLS (restless legs syndrome)    Wears contact lenses     Patient Active Problem List   Diagnosis Date Noted   Endocervical polyp    Endometrial polyp    Postmenopausal bleeding 12/25/2021   Osteopenia after menopause 08/04/2021   Primary osteoarthritis of right shoulder 03/01/2021   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 11/10/2020   Lumbago 12/26/2016   Myofascial pain syndrome 07/26/2012   Cervical radiculopathy at C8 06/26/2012   Hyperlipidemia 03/30/2010   INSOMNIA 06/04/2009   IFG (impaired fasting glucose) 07/16/2008   FATIGUE 07/14/2008   POSTMENOPAUSAL STATUS 07/14/2008   Essential hypertension, benign  08/22/2007   Generalized anxiety disorder 07/17/2006   TOBACCO DEPENDENCE 07/17/2006    Past Surgical History:  Procedure Laterality Date   HYSTEROSCOPY WITH D & C N/A 01/04/2022   Procedure: DILATATION AND CURETTAGE /HYSTEROSCOPY/POLYPECTOMY;  Surgeon: Radene Gunning, MD;  Location: [facility];  Service: Gynecology;  Laterality: N/A;   KNEE ARTHROSCOPY Left    1980, 1981   MOHS SURGERY  01/2021   TONSILLECTOMY AND ADENOIDECTOMY  1964   age 29    OB History     Gravida  2   Para  2   Term  2   Preterm      AB      Living         SAB      IAB      Ectopic      Multiple      Live Births  2            Home Medications    Prior to Admission medications   Medication Sig Start Date End Date Taking? Authorizing Provider  ALPRAZolam (XANAX) 0.5 MG tablet TAKE 1/2 TO 1 TABLET BY MOUTH TWO TIMES A DAY AS NEEDED FOR ANXIETY.  USE SPARINGLY. 03/17/22  Yes Hali Marry, MD  aspirin EC 81 MG tablet Take 81 mg by mouth daily. Swallow whole.   Yes [provider]  azithromycin (ZITHROMAX Z-PAK)  250 MG tablet Take 2 tabs today; then begin one tab once daily for 4 more days. 05/23/22  Yes Kandra Nicolas, MD  guaiFENesin-codeine 100-10 MG/5ML syrup Take 5m by mouth at bedtime as needed for cough. 05/23/22  Yes BKandra Nicolas MD  losartan-hydrochlorothiazide (HYZAAR) 50-12.5 MG tablet TAKE ONE TABLET BY MOUTH DAILY 01/09/22  Yes MHali Marry MD  simvastatin (ZOCOR) 40 MG tablet TAKE ONE TABLET BY MOUTH EVERY EVENING AT 6 Patient taking differently: TAKE ONE TABLET BY MOUTH EVERY EVENING AT 6 10/04/21  Yes MHali Marry MD    Family History Family History  Problem Relation Age of Onset   Heart attack Mother 664  Stroke Mother    Breast cancer Mother 372  COPD Mother        smoker    Social History Social History   Tobacco Use   Smoking status: Every Day    Packs/day: 1.00    Years: 52.00    Total pack years:  52.00    Types: Cigarettes   Smokeless tobacco: Never   Tobacco comments:    1 pack a day   Vaping Use   Vaping Use: Never used  Substance Use Topics   Alcohol use: Yes    Alcohol/week: 14.0 standard drinks of alcohol    Types: 14 Glasses of wine per week    Comment: 2 glasses wine daily   Drug use: No     Allergies   Citalopram hydrobromide, Effexor [venlafaxine], Lisinopril, and Sertraline   Review of Systems Review of Systems No sore throat + cough No pleuritic pain ? wheezing + nasal congestion + post-nasal drainage No sinus pain/pressure No itchy/red eyes No earache No hemoptysis + SOB No fever, + chills No nausea No vomiting No abdominal pain No diarrhea No urinary symptoms No skin rash + fatigue No myalgias No headache Used OTC meds without relief    Physical Exam Triage Vital Signs ED Triage Vitals  Enc Vitals Group     BP 05/23/22 1512 103/69     Pulse Rate 05/23/22 1512 94     Resp 05/23/22 1512 18     Temp 05/23/22 1512 98.3 F (36.8 C)     Temp Source 05/23/22 1512 Oral     SpO2 05/23/22 1512 95 %     Weight --      Height --      Head Circumference --      Peak Flow --      Pain Score 05/23/22 1513 0     Pain Loc --      Pain Edu? --      Excl. in [place] --    No data found.  Updated Vital Signs BP 103/69 (BP Location: Right Arm)   Pulse 94   Temp 98.3 F (36.8 C) (Oral)   Resp 18   SpO2 95%   Visual Acuity Right Eye Distance:   Left Eye Distance:   Bilateral Distance:    Right Eye Near:   Left Eye Near:    Bilateral Near:     Physical Exam Nursing notes and Vital Signs reviewed. Appearance:  Patient appears stated age, and in no acute distress Eyes:  Pupils are equal, round, and reactive to light and accomodation.  Extraocular movement is intact.  Conjunctivae are not inflamed  Ears:  Canals normal.  Tympanic membranes normal.  Nose:  Normal turbinates.  No sinus tenderness.   Pharynx:  Normal Neck:  Supple.  No  adenopathy.   Lungs:  Faint wheezes right posterior superior chest.  Breath sounds are equal.    Heart:  Regular rate and rhythm without murmurs, rubs, or gallops.  Abdomen:  Nontender without masses or hepatosplenomegaly.  Bowel sounds are present.  No CVA or flank tenderness.  Extremities:  No edema.  Skin:  No rash present.   UC Treatments / Results  Labs (all labs ordered are listed, but only abnormal results are displayed) Labs Reviewed - No data to display  EKG   Radiology DG Chest 2 View  Result Date: 05/23/2022 CLINICAL DATA:  Persistent cough EXAM: CHEST - 2 VIEW COMPARISON:  CT chest 03/22/2022 FINDINGS: The heart size and mediastinal contours are within normal limits. Both lungs are clear. The visualized skeletal structures are unremarkable. IMPRESSION: No active cardiopulmonary disease. Electronically Signed   By: Ronney Asters M.D.   On: 05/23/2022 16:20    Procedures Procedures (including critical care time)  Medications Ordered in UC Medications  methylPREDNISolone acetate (DEPO-MEDROL) injection 80 mg (80 mg Intramuscular Given 05/23/22 1708)    Initial Impression / Assessment and Plan / UC Course  I have reviewed the triage vital signs and the nursing notes.  Pertinent labs & imaging results that were available during my care of the patient were reviewed by me and considered in my medical decision making (see chart for details).    Administered Depo Medrol '80mg'$  IM Rx for Z-pak Rx for Robitussin AC for night time cough. Controlled Substance Prescriptions I have consulted the [place] Controlled Substances Registry for this patient, and feel the risk/benefit ratio today is favorable for proceeding with this prescription for a controlled substance. Followup with Family Doctor if not improved in one week.   Final Clinical Impressions(s) / UC Diagnoses   Final diagnoses:  Persistent cough     Discharge Instructions      Stay well hydrated and get adequate rest.    For sinus congestion may use Afrin nasal spray (or generic oxymetazoline) each morning for about 5 days and then discontinue.  Also recommend using saline nasal spray several times daily and saline nasal irrigation (AYR is a common brand).  Use Flonase nasal spray each morning after using Afrin nasal spray and saline nasal irrigation. Try warm salt water gargles for sore throat.  If symptoms become significantly worse during the night or over the weekend, proceed to the local emergency room.      ED Prescriptions     Medication Sig Dispense Auth. Provider   azithromycin (ZITHROMAX Z-PAK) 250 MG tablet Take 2 tabs today; then begin one tab once daily for 4 more days. 6 tablet Kandra Nicolas, MD   guaiFENesin-codeine 100-10 MG/5ML syrup Take 28m by mouth at bedtime as needed for cough. 50 mL BKandra Nicolas MD         BKandra Nicolas MD 05/25/22 1356

## 2022-05-29 ENCOUNTER — Encounter: Payer: Self-pay | Admitting: Family Medicine

## 2022-05-29 ENCOUNTER — Ambulatory Visit (INDEPENDENT_AMBULATORY_CARE_PROVIDER_SITE_OTHER): Payer: Medicare Other | Admitting: Family Medicine

## 2022-05-29 VITALS — BP 132/60 | HR 88 | Temp 98.4°F | Resp 19 | Wt 164.0 lb

## 2022-05-29 DIAGNOSIS — J321 Chronic frontal sinusitis: Secondary | ICD-10-CM

## 2022-05-29 MED ORDER — AMOXICILLIN-POT CLAVULANATE 875-125 MG PO TABS: 1.0000 | ORAL_TABLET | Freq: Two times a day (BID) | ORAL | 0 refills | Status: DC

## 2022-05-29 MED ORDER — GUAIFENESIN-CODEINE 100-10 MG/5ML PO SOLN: ORAL | 0 refills | Status: DC

## 2022-05-29 NOTE — Progress Notes (Signed)
Acute Office Visit  Subjective:     Patient ID: Valerie Carroll, female    DOB: May 09, 1956, 66 y.o.   MRN: [id number]  Chief Complaint  Patient presents with   Cough    HPI Patient is in today for URI sxs since June.  She was treated for sinusitis in June.  She has also had a CT of the chest done around that time for lung cancer screening and it did show some thickening of the bronchial tubes also suggested underlying COPD.  She went to urgent care again on August 15 and had a chest x-ray which was negative.  She was given azithromycin and Robitussin-AC as well as a Depo-Medrol 80 mg IM.  She still has pretty significant nasal congestion.  And amount of postnasal drip and drainage is causing her cough.  Cough can last for 15 to 20 minutes at a time.  No shortness of breath.  No chest symptoms.  Has had a pretty persistent frontal headache.  And thick nasal congestion and mucus.  In June when symptoms first started she was treated with doxycycline.    ROS      Objective:    BP 132/60   Pulse 88   Temp 98.4 F (36.9 C) (Oral)   Resp 19   Wt 164 lb (74.4 kg)   SpO2 96%   BMI 27.29 kg/m    Physical Exam Constitutional:      Appearance: She is well-developed.  HENT:     Head: Normocephalic and atraumatic.     Comments: Right TM looks abnormal.     Right Ear: Ear canal and external ear normal.     Left Ear: Tympanic membrane, ear canal and external ear normal.     Nose: Nose normal.     Mouth/Throat:     Pharynx: Oropharynx is clear.  Eyes:     Conjunctiva/sclera: Conjunctivae normal.     Pupils: Pupils are equal, round, and reactive to light.  Neck:     Thyroid: No thyromegaly.  Cardiovascular:     Rate and Rhythm: Normal rate and regular rhythm.     Heart sounds: Normal heart sounds.  Pulmonary:     Effort: Pulmonary effort is normal.     Breath sounds: Normal breath sounds. No wheezing.  Musculoskeletal:     Cervical back: Neck supple.  Lymphadenopathy:      Cervical: No cervical adenopathy.  Skin:    General: Skin is warm and dry.  Neurological:     Mental Status: She is alert and oriented to person, place, and time.     No results found for any visits on 05/29/22.      Assessment & Plan:   Problem List Items Addressed This Visit   None Visit Diagnoses     Chronic frontal sinusitis    -  Primary   Relevant Medications   guaiFENesin-codeine 100-10 MG/5ML syrup   amoxicillin-clavulanate (AUGMENTIN) 875-125 MG tablet   Other Relevant Orders   Ambulatory referral to ENT       Chronic frontal sinusitis-at this point she has had symptoms for almost 3 months.  We will treat as a chronic sinusitis with Augmentin for 21 days.  Did refill her cough syrup that she just uses at bedtime.  Also recommend nasal saline irrigation, not just a spray and then using a nasal steroid spray 2 sprays in each nostril daily.  If she is not improving in the next 2 to 3 weeks then recommend ENT  evaluation for further work-up.  If everything is negative then consider allergy work-up.  Meds ordered this encounter  Medications   guaiFENesin-codeine 100-10 MG/5ML syrup    Sig: Take 70m by mouth at bedtime as needed for cough.    Dispense:  60 mL    Refill:  0   amoxicillin-clavulanate (AUGMENTIN) 875-125 MG tablet    Sig: Take 1 tablet by mouth 2 (two) times daily.    Dispense:  42 tablet    Refill:  0    No follow-ups on file.  CBeatrice Lecher MD

## 2022-05-29 NOTE — Patient Instructions (Signed)
Recommend nasal saline irrigation once a day followed by 2 sprays in each nostril of either Flonase or Nasonex.

## 2022-06-11 ENCOUNTER — Encounter: Payer: Self-pay | Admitting: Family Medicine

## 2022-06-13 MED ORDER — FLUCONAZOLE 150 MG PO TABS: 150.0000 mg | ORAL_TABLET | Freq: Every day | ORAL | 1 refills | Status: DC

## 2022-06-19 ENCOUNTER — Ambulatory Visit: Payer: Medicare Other | Admitting: Family Medicine

## 2022-07-10 ENCOUNTER — Other Ambulatory Visit: Payer: Self-pay | Admitting: Family Medicine

## 2022-07-10 DIAGNOSIS — F411 Generalized anxiety disorder: Secondary | ICD-10-CM

## 2022-07-10 DIAGNOSIS — I10 Essential (primary) hypertension: Secondary | ICD-10-CM

## 2022-07-10 DIAGNOSIS — E785 Hyperlipidemia, unspecified: Secondary | ICD-10-CM

## 2022-07-11 ENCOUNTER — Encounter: Payer: Self-pay | Admitting: Family Medicine

## 2022-07-11 MED ORDER — ESCITALOPRAM OXALATE 20 MG PO TABS: 20.0000 mg | ORAL_TABLET | Freq: Every day | ORAL | 1 refills | Status: DC

## 2022-09-07 ENCOUNTER — Encounter: Payer: Self-pay | Admitting: Family Medicine

## 2022-09-08 ENCOUNTER — Telehealth: Payer: Medicare Other | Admitting: Family Medicine

## 2022-09-08 DIAGNOSIS — J329 Chronic sinusitis, unspecified: Secondary | ICD-10-CM

## 2022-09-08 MED ORDER — DOXYCYCLINE HYCLATE 100 MG PO TABS: 100.0000 mg | ORAL_TABLET | Freq: Two times a day (BID) | ORAL | 0 refills | Status: AC

## 2022-09-08 NOTE — Progress Notes (Signed)

## 2022-10-13 ENCOUNTER — Ambulatory Visit (INDEPENDENT_AMBULATORY_CARE_PROVIDER_SITE_OTHER): Payer: Medicare Other

## 2022-10-13 ENCOUNTER — Ambulatory Visit (INDEPENDENT_AMBULATORY_CARE_PROVIDER_SITE_OTHER): Payer: Medicare Other | Admitting: Sports Medicine

## 2022-10-13 DIAGNOSIS — M19011 Primary osteoarthritis, right shoulder: Secondary | ICD-10-CM

## 2022-10-13 MED ORDER — TRIAMCINOLONE ACETONIDE 40 MG/ML IJ SUSP
40.0000 mg | Freq: Once | INTRAMUSCULAR | Status: AC
  Administered 2022-10-13: 40 mg via INTRAMUSCULAR

## 2022-10-13 NOTE — Progress Notes (Signed)
    Procedures performed today:    Procedure: Real-time Ultrasound Guided injection of the right glenohumeral joint Device: Samsung HS60  Verbal informed consent obtained.  Time-out conducted.  Noted no overlying erythema, induration, or other signs of local infection.  Skin prepped in a sterile fashion.  Local anesthesia: Topical Ethyl chloride.  With sterile technique and under real time ultrasound guidance: Arthritic joint noted, 1 cc Kenalog 40, 2 cc lidocaine, 2 cc bupivacaine injected easily Completed without difficulty  Advised to call if fevers/chills, erythema, induration, drainage, or persistent bleeding.  Images permanently stored and available for review in PACS.  Impression: Technically successful ultrasound guided injection.  Independent interpretation of notes and tests performed by another provider:   None.  Brief History, Exam, Impression, and Recommendations:    Primary osteoarthritis of right shoulder Pleasant 67 year old female known glenohumeral osteoarthritis, chronic, last injected July 2023, now with recurrence of pain, repeat glenohumeral joint injection today, return as needed.    ____________________________________________ Gwen Her. Dianah Field, M.D., ABFM., CAQSM., AME. Primary Care and Sports Medicine [facility]  Adjunct Professor of [place] [employer] [facility] of [employer]  Risk manager

## 2022-10-13 NOTE — Assessment & Plan Note (Signed)
Pleasant 67 year old female known glenohumeral osteoarthritis, chronic, last injected July 2023, now with recurrence of pain, repeat glenohumeral joint injection today, return as needed.

## 2022-10-13 NOTE — Addendum Note (Signed)
Addended by: Tarri Glenn A on: 10/13/2022 04:24 PM   Modules accepted: Orders

## 2022-11-02 ENCOUNTER — Other Ambulatory Visit: Payer: Self-pay | Admitting: Family Medicine

## 2022-11-02 DIAGNOSIS — F411 Generalized anxiety disorder: Secondary | ICD-10-CM

## 2022-11-02 NOTE — Telephone Encounter (Signed)
Last written 07/10/2022 #60 no refills Last appt 10/13/2022 (with Dr. Darene Lamer) 05/29/2022 (acute visit) 03/17/2022 (regular follow up)

## 2023-01-11 ENCOUNTER — Other Ambulatory Visit: Payer: Self-pay | Admitting: Family Medicine

## 2023-01-11 DIAGNOSIS — I10 Essential (primary) hypertension: Secondary | ICD-10-CM

## 2023-01-19 ENCOUNTER — Ambulatory Visit (INDEPENDENT_AMBULATORY_CARE_PROVIDER_SITE_OTHER): Payer: Medicare Other

## 2023-01-19 ENCOUNTER — Other Ambulatory Visit (INDEPENDENT_AMBULATORY_CARE_PROVIDER_SITE_OTHER): Payer: Medicare Other

## 2023-01-19 ENCOUNTER — Ambulatory Visit (INDEPENDENT_AMBULATORY_CARE_PROVIDER_SITE_OTHER): Payer: Medicare Other | Admitting: Sports Medicine

## 2023-01-19 DIAGNOSIS — M25512 Pain in left shoulder: Secondary | ICD-10-CM | POA: Diagnosis not present

## 2023-01-19 DIAGNOSIS — M25562 Pain in left knee: Secondary | ICD-10-CM | POA: Diagnosis not present

## 2023-01-19 DIAGNOSIS — M170 Bilateral primary osteoarthritis of knee: Secondary | ICD-10-CM

## 2023-01-19 DIAGNOSIS — M19011 Primary osteoarthritis, right shoulder: Secondary | ICD-10-CM

## 2023-01-19 DIAGNOSIS — M19012 Primary osteoarthritis, left shoulder: Secondary | ICD-10-CM

## 2023-01-19 DIAGNOSIS — M25561 Pain in right knee: Secondary | ICD-10-CM | POA: Diagnosis not present

## 2023-01-19 MED ORDER — CELECOXIB 200 MG PO CAPS
ORAL_CAPSULE | ORAL | 2 refills | Status: DC
Start: 2023-01-19 — End: 2023-02-22

## 2023-01-19 NOTE — Progress Notes (Signed)
    Procedures performed today:    Procedure: Real-time Ultrasound Guided injection of the right glenohumeral joint Device: Samsung HS60  Verbal informed consent obtained.  Time-out conducted.  Noted no overlying erythema, induration, or other signs of local infection.  Skin prepped in a sterile fashion.  Local anesthesia: Topical Ethyl chloride.  With sterile technique and under real time ultrasound guidance: Arthritic joint noted, 1 cc Kenalog 40, 2 cc lidocaine, 2 cc bupivacaine injected easily Completed without difficulty  Advised to call if fevers/chills, erythema, induration, drainage, or persistent bleeding.  Images permanently stored and available for review in PACS.  Impression: Technically successful ultrasound guided injection.  Independent interpretation of notes and tests performed by another provider:   None.  Brief History, Exam, Impression, and Recommendations:    Primary osteoarthritis of both shoulders Known glenohumeral osteoarthritis, last injected January 2024, recurrence of pain, repeat right glenohumeral joint injection today. Increasing pain left shoulder, adding some x-rays.  Primary osteoarthritis of both knees Pain, mild effusion bilaterally, adding x-rays, home PT, switching to Celebrex, return to see me in 6 weeks, injection if not better.    ____________________________________________ Ihor Austin. Benjamin Stain, M.D., ABFM., CAQSM., AME. Primary Care and Sports Medicine  [facility]  Adjunct Professor of Family Medicine  [place] [employer] [facility] of Medicine  Restaurant manager, fast food

## 2023-01-19 NOTE — Assessment & Plan Note (Signed)
Pain, mild effusion bilaterally, adding x-rays, home PT, switching to Celebrex, return to see me in 6 weeks, injection if not better.

## 2023-01-19 NOTE — Assessment & Plan Note (Addendum)
Known glenohumeral osteoarthritis, last injected January 2024, recurrence of pain, repeat right glenohumeral joint injection today. Increasing pain left shoulder, adding some x-rays.

## 2023-01-29 ENCOUNTER — Ambulatory Visit (INDEPENDENT_AMBULATORY_CARE_PROVIDER_SITE_OTHER): Payer: Medicare Other | Admitting: Family Medicine

## 2023-01-29 DIAGNOSIS — Z0000 Encounter for general adult medical examination without abnormal findings: Secondary | ICD-10-CM

## 2023-01-29 DIAGNOSIS — Z780 Asymptomatic menopausal state: Secondary | ICD-10-CM

## 2023-01-29 DIAGNOSIS — Z1231 Encounter for screening mammogram for malignant neoplasm of breast: Secondary | ICD-10-CM

## 2023-01-29 DIAGNOSIS — Z1211 Encounter for screening for malignant neoplasm of colon: Secondary | ICD-10-CM | POA: Diagnosis not present

## 2023-01-29 NOTE — Progress Notes (Signed)
MEDICARE ANNUAL WELLNESS VISIT  01/29/2023  Telephone Visit Disclaimer This Medicare AWV was conducted by telephone due to national recommendations for restrictions regarding the COVID-19 Pandemic (e.g. social distancing).  I verified, using two identifiers, that I am speaking with Valerie Carroll or their authorized healthcare agent. I discussed the limitations, risks, security, and privacy concerns of performing an evaluation and management service by telephone and the potential availability of an in-person appointment in the future. The patient expressed understanding and agreed to proceed.  Location of Patient: Home Location of Provider (nurse):  Provider home  Subjective:    Valerie Carroll is a 67 y.o. female patient of Metheney, Barbarann Ehlers, MD who had a Medicare Annual Wellness Visit today via telephone. Yamina is Retired and lives with their spouse. she has 2 children. she reports that she is socially active and does interact with friends/family regularly. she is moderately physically active and enjoys reading and spending time with friends.  Patient Care Team: Agapito Games, MD as PCP - General     01/29/2023    1:56 PM 01/23/2022    2:03 PM 01/04/2022    8:12 AM  Advanced Directives  Does Patient Have a Medical Advance Directive? Yes Yes Yes  Type of Advance Directive Living will Living will Healthcare Power of Attorney  Does patient want to make changes to medical advance directive? No - Patient declined No - Patient declined   Copy of Healthcare Power of Attorney in Chart?  No - copy requested No - copy requested    Hospital Utilization Over the Past 12 Months: # of hospitalizations or ER visits: 0 # of surgeries: 0  Review of Systems    Patient reports that her overall health is unchanged compared to last year.  History obtained from chart review and the patient  Patient Reported Readings (BP, Pulse, CBG, Weight, etc) none  Pain Assessment Pain :  No/denies pain     Current Medications & Allergies (verified) Allergies as of 01/29/2023       Reactions   Citalopram Hydrobromide Nausea Only   Effexor [venlafaxine] Other (See Comments)   Brain zaps, and feeling mentally sluggish   Lisinopril Cough   Sertraline Other (See Comments)   Headaches and brain zaps        Medication List        Accurate as of January 29, 2023  2:17 PM. If you have any questions, ask your nurse or doctor.          ALPRAZolam 0.5 MG tablet Commonly known as: XANAX TAKE 1/2 TO 1 TABLET BY MOUTH TWO TIMES A DAY AS NEEDED FOR ANXIETY   aspirin EC 81 MG tablet Take 81 mg by mouth daily. Swallow whole.   celecoxib 200 MG capsule Commonly known as: CeleBREX One to 2 tablets by mouth daily as needed for pain.   escitalopram 20 MG tablet Commonly known as: LEXAPRO Take 1 tablet (20 mg total) by mouth daily. NO REFILLS. NEEDS A F/U APPT   losartan-hydrochlorothiazide 50-12.5 MG tablet Commonly known as: HYZAAR Take 1 tablet by mouth daily. NO REFILLS. NEEDS A F/U APPT   simvastatin 40 MG tablet Commonly known as: ZOCOR TAKE ONE TABLET BY MOUTH EVERY EVENING AT 6        History (reviewed): Past Medical History:  Diagnosis Date   Arthritis    right shoulder   GAD (generalized anxiety disorder)    History of basal cell carcinoma (BCC) excision    History  of melanoma excision 11/2020   s/p  MOH's surgery on nose , localized   Hyperlipidemia    Hypertension    followed by pcp   (hx evaluated by cardiology for pre-corial pain and abnormal ekg by dr Jens Som,  normal CCTA 01-21-2021 no disease)   PMB (postmenopausal bleeding)    Pre-diabetes    RLS (restless legs syndrome)    Wears contact lenses    Past Surgical History:  Procedure Laterality Date   HYSTEROSCOPY WITH D & C N/A 01/04/2022   Procedure: DILATATION AND CURETTAGE /HYSTEROSCOPY/POLYPECTOMY;  Surgeon: Milas Hock, MD;  Location: [facility];  Service:  Gynecology;  Laterality: N/A;   KNEE ARTHROSCOPY Left    1980, 1981   MOHS SURGERY  01/2021   TONSILLECTOMY AND ADENOIDECTOMY  1964   age 67   Family History  Problem Relation Age of Onset   Heart attack Mother 27   Stroke Mother    Breast cancer Mother 22   COPD Mother        smoker   Social History   Socioeconomic History   Marital status: Married    Spouse name: Leonette Most   Number of children: 2   Years of education: 14   Highest education level: Associate degree: academic program  Occupational History    Employer: [employer]   Occupation: Retired  Tobacco Use   Smoking status: Every Day    Packs/day: 1.00    Years: 52.00    Additional pack years: 0.00    Total pack years: 52.00    Types: Cigarettes   Smokeless tobacco: Never   Tobacco comments:    1 pack a day   Vaping Use   Vaping Use: Never used  Substance and Sexual Activity   Alcohol use: Not Currently    Comment: occasionally   Drug use: No   Sexual activity: Not on file  Other Topics Concern   Not on file  Social History Narrative   Lives with husband. She enjoys reading and spending time with friends.   Social Determinants of Health   Financial Resource Strain: Low Risk  (01/29/2023)   Overall Financial Resource Strain (CARDIA)    Difficulty of Paying Living Expenses: Not hard at all  Food Insecurity: No Food Insecurity (01/29/2023)   Hunger Vital Sign    Worried About Running Out of Food in the Last Year: Never true    Ran Out of Food in the Last Year: Never true  Transportation Needs: No Transportation Needs (01/29/2023)   PRAPARE - Administrator, Civil Service (Medical): No    Lack of Transportation (Non-Medical): No  Physical Activity: Insufficiently Active (01/29/2023)   Exercise Vital Sign    Days of Exercise per Week: 1 day    Minutes of Exercise per Session: 120 min  Stress: No Stress Concern Present (01/29/2023)   [employer] of Occupational Health - Occupational Stress  Questionnaire    Feeling of Stress : Not at all  Social Connections: Moderately Isolated (01/29/2023)   Social Connection and Isolation Panel [NHANES]    Frequency of Communication with Friends and Family: More than three times a week    Frequency of Social Gatherings with Friends and Family: Twice a week    Attends Religious Services: Never    Database administrator or Organizations: No    Attends Banker Meetings: Never    Marital Status: Married    Activities of Daily Living    01/29/2023  2:03 PM  In your present state of health, do you have any difficulty performing the following activities:  Hearing? 0  Vision? 0  Difficulty concentrating or making decisions? 0  Walking or climbing stairs? 0  Dressing or bathing? 0  Doing errands, shopping? 0  Preparing Food and eating ? N  Using the Toilet? N  In the past six months, have you accidently leaked urine? N  Do you have problems with loss of bowel control? N  Managing your Medications? N  Managing your Finances? N  Housekeeping or managing your Housekeeping? N    Patient Education/ Literacy How often do you need to have someone help you when you read instructions, pamphlets, or other written materials from your doctor or pharmacy?: 1 - Never What is the last grade level you completed in school?: two years of college  Exercise Current Exercise Habits: Home exercise routine, Type of exercise: Other - see comments (yard work), Time (Minutes): > 60, Frequency (Times/Week): 1, Weekly Exercise (Minutes/Week): 0, Intensity: Moderate, Exercise limited by: None identified  Diet Patient reports consuming 2 meals a day and 1 snack(s) a day Patient reports that her primary diet is: Regular Patient reports that she does have regular access to food.   Depression Screen    01/29/2023    1:57 PM 03/17/2022    2:55 PM 01/23/2022    2:03 PM 01/18/2022   10:54 AM 07/13/2021    9:28 AM 11/10/2020    4:50 PM 03/31/2020    8:38  AM  PHQ 2/9 Scores  PHQ - 2 Score 0 2 0 5 4 3  0  PHQ- 9 Score  8 1 12 13 9       Fall Risk    01/29/2023    1:57 PM 03/17/2022    2:55 PM 03/17/2022    2:21 PM 01/23/2022    2:03 PM 01/18/2022    9:32 AM  Fall Risk   Falls in the past year? 0 0 0 0 0  Number falls in past yr: 0 0 0 0 0  Injury with Fall? 0 0 0 0 0  Risk for fall due to : No Fall Risks No Fall Risks No Fall Risks No Fall Risks No Fall Risks  Follow up Falls evaluation completed Falls prevention discussed Falls prevention discussed;Falls evaluation completed Falls evaluation completed Falls prevention discussed;Falls evaluation completed     Objective:  Ajeenah Heiny seemed alert and oriented and she participated appropriately during our telephone visit.  Blood Pressure Weight BMI  BP Readings from Last 3 Encounters:  05/29/22 132/60  05/23/22 103/69  03/17/22 136/60   Wt Readings from Last 3 Encounters:  05/29/22 164 lb (74.4 kg)  03/17/22 169 lb (76.7 kg)  02/02/22 169 lb (76.7 kg)   BMI Readings from Last 1 Encounters:  05/29/22 27.29 kg/m    *Unable to obtain current vital signs, weight, and BMI due to telephone visit type  Hearing/Vision  Brittni did not seem to have difficulty with hearing/understanding during the telephone conversation Reports that she has had a formal eye exam by an eye care professional within the past year Reports that she has not had a formal hearing evaluation within the past year *Unable to fully assess hearing and vision during telephone visit type  Cognitive Function:    01/29/2023    2:05 PM 01/23/2022    2:09 PM 11/10/2020    4:48 PM  6CIT Screen  What Year? 0 points 0 points 0 points  What  month? 0 points 0 points 0 points  What time? 0 points 0 points 0 points  Count back from 20 0 points 0 points 0 points  Months in reverse 0 points 0 points 4 points  Repeat phrase 0 points 0 points 4 points  Total Score 0 points 0 points 8 points   (Normal:0-7, Significant for  Dysfunction: >8)  Normal Cognitive Function Screening: Yes   Immunization & Health Maintenance Record Immunization History  Administered Date(s) Administered   Fluad Quad(high Dose 65+) 07/21/2020   Influenza Split 07/22/2012, 08/23/2019   Influenza-Unspecified 05/20/2021, 07/13/2022   PFIZER(Purple Top)SARS-COV-2 Vaccination 11/18/2019, 12/13/2019, 07/21/2020   Rsv, Bivalent, Protein Subunit Rsvpref,pf (Abrysvo) 07/18/2022   Td 03/01/2006   Tdap 07/22/2012   Zoster, Live 09/18/2012    Health Maintenance  Topic Date Due   DTaP/Tdap/Td (3 - Td or Tdap) 07/22/2022   COVID-19 Vaccine (4 - 2023-24 season) 02/14/2023 (Originally 06/09/2022)   Zoster Vaccines- Shingrix (1 of 2) 04/30/2023 (Originally [birth year]/18/1974)   Pneumonia Vaccine 25+ Years old (1 of 2 - PCV) 01/29/2024 (Originally 03/18/1962)   MAMMOGRAM  01/29/2024 (Originally 05/19/2022)   Lung Cancer Screening  03/23/2023   Fecal DNA (Cologuard)  04/15/2023   INFLUENZA VACCINE  05/10/2023   Medicare Annual Wellness (AWV)  01/29/2024   DEXA SCAN  Completed   Hepatitis C Screening  Completed   HPV VACCINES  Aged Out   COLONOSCOPY (Pts 45-37yrs Insurance coverage will need to be confirmed)  Discontinued       Assessment  This is a routine wellness examination for [employer].  Health Maintenance: Due or Overdue Health Maintenance Due  Topic Date Due   DTaP/Tdap/Td (3 - Td or Tdap) 07/22/2022    Valerie Carroll does not need a referral for Community Assistance: Care Management:   no Social Work:    no Prescription Assistance:  no Nutrition/Diabetes Education:  no   Plan:  Personalized Goals  Goals Addressed               This Visit's Progress     Patient Stated (pt-stated)        01/29/2023 AWV Goal: Tobacco Cessation  Smoking cessation instruction/counseling given:  counseled patient on the dangers of tobacco use, advised patient to stop smoking, and reviewed strategies to maximize success  Patient will  verbalize understanding of the health risks associated with smoking/tobacco use Lung cancer or lung disease, such as COPD Heart disease. Stroke. Heart attack Infertility Osteoporosis and bone fractures. Patient will create a plan to quit smoking/using tobacco Pick a date to quit.  Write down the reasons why you are quitting and put it where you will see it often. Identify the people, places, things, and activities that make you want to smoke (triggers) and avoid them. Make sure to take these actions: Throw away all cigarettes at home, at work, and in your car. Throw away smoking accessories, such as Set designer. Clean your car and make sure to empty the ashtray. Clean your home, including curtains and carpets. Tell your family, friends, and coworkers that you are quitting. Support from your loved ones can make quitting easier. Talk with your health care provider about your options for quitting smoking. Find out what treatment options are covered by your health insurance. Patient will be able to demonstrate knowledge of tobacco cessation strategies that may maximize success Quitting "cold [place]" is more successful than gradually quitting. Attending in-person counseling to help you build problem-solving skills.  Finding resources and support  systems that can help you to quit smoking and remain smoke-free after you quit. These resources are most helpful when you use them often. They can include: Online chats with a Veterinary surgeon. Telephone quitlines. Printed Materials engineer. Support groups or group counseling. Text messaging programs. Mobile phone applications. Taking medicines to help you quit smoking: Nicotine patches, gum, or lozenges. Nicotine inhalers or sprays. Non-nicotine medicine that is taken by mouth. Patient will note get discouraged if the process is difficult Over the next year, patient will stop smoking or using other forms of tobacco  Smoking cessation  instruction/counseling given:  counseled patient on the dangers of tobacco use, advised patient to stop smoking, and reviewed strategies to maximize success         Personalized Health Maintenance & Screening Recommendations  Pneumococcal vaccine  Screening mammography Shingles vaccine Tetanus vaccine Bone density scan   Lung Cancer Screening Recommended: yes; scheduled (Low Dose CT Chest recommended if Age 85-80 years, 20 pack-year currently smoking OR have quit w/in past 15 years) Hepatitis C Screening recommended: no HIV Screening recommended: no  Advanced Directives: Written information was not prepared per patient's request.  Referrals & Orders Orders Placed This Encounter  Procedures   DEXAScan   Mammogram 3D SCREEN BREAST BILATERAL   Cologuard    Follow-up Plan Follow-up with Agapito Games, MD as planned Schedule tetanus vaccine and shingles vaccine at the pharmacy.  Pneumonia vaccine can be done in the office or at the pharmacy. Cologuard due in mid July.  Bone density and mammogram referral has been sent.  Medicare wellness visit in one year.  Patient will access AVS on my chart.   I have personally reviewed and noted the following in the patient's chart:   Medical and social history Use of alcohol, tobacco or illicit drugs  Current medications and supplements Functional ability and status Nutritional status Physical activity Advanced directives List of other physicians Hospitalizations, surgeries, and ER visits in previous 12 months Vitals Screenings to include cognitive, depression, and falls Referrals and appointments  In addition, I have reviewed and discussed with Valerie Carroll certain preventive protocols, quality metrics, and best practice recommendations. A written personalized care plan for preventive services as well as general preventive health recommendations is available and can be mailed to the patient at her request.      Modesto Charon, RN BSN  01/29/2023

## 2023-01-29 NOTE — Patient Instructions (Addendum)
MEDICARE ANNUAL WELLNESS VISIT Health Maintenance Summary and Written Plan of Care  [name] ,  Thank you for allowing me to perform your Medicare Annual Wellness Visit and for your ongoing commitment to your health.   Health Maintenance & Immunization History Health Maintenance  Topic Date Due   DTaP/Tdap/Td (3 - Td or Tdap) 07/22/2022   COVID-19 Vaccine (4 - 2023-24 season) 02/14/2023 (Originally 06/09/2022)   Zoster Vaccines- Shingrix (1 of 2) 04/30/2023 (Originally [birth year]/1974)   Pneumonia Vaccine 38+ Years old (1 of 2 - PCV) 01/29/2024 (Originally 03/18/1962)   MAMMOGRAM  01/29/2024 (Originally 05/19/2022)   Lung Cancer Screening  03/23/2023   Fecal DNA (Cologuard)  04/15/2023   INFLUENZA VACCINE  05/10/2023   Medicare Annual Wellness (AWV)  01/29/2024   DEXA SCAN  Completed   Hepatitis C Screening  Completed   HPV VACCINES  Aged Out   COLONOSCOPY (Pts 45-71yrs Insurance coverage will need to be confirmed)  Discontinued   Immunization History  Administered Date(s) Administered   Fluad Quad(high Dose 65+) 07/21/2020   Influenza Split 07/22/2012, 08/23/2019   Influenza-Unspecified 05/20/2021, 07/13/2022   PFIZER(Purple Top)SARS-COV-2 Vaccination 11/18/2019, 12/13/2019, 07/21/2020   Rsv, Bivalent, Protein Subunit Rsvpref,pf Verdis Frederickson) 07/18/2022   Td 03/01/2006   Tdap 07/22/2012   Zoster, Live 09/18/2012    These are the patient goals that we discussed:  Goals Addressed               This Visit's Progress     Patient Stated (pt-stated)        01/29/2023 AWV Goal: Tobacco Cessation  Smoking cessation instruction/counseling given:  counseled patient on the dangers of tobacco use, advised patient to stop smoking, and reviewed strategies to maximize success  Patient will verbalize understanding of the health risks associated with smoking/tobacco use Lung cancer or lung disease, such as COPD Heart disease. Stroke. Heart attack Infertility Osteoporosis and bone  fractures. Patient will create a plan to quit smoking/using tobacco Pick a date to quit.  Write down the reasons why you are quitting and put it where you will see it often. Identify the people, places, things, and activities that make you want to smoke (triggers) and avoid them. Make sure to take these actions: Throw away all cigarettes at home, at work, and in your car. Throw away smoking accessories, such as Set designer. Clean your car and make sure to empty the ashtray. Clean your home, including curtains and carpets. Tell your family, friends, and coworkers that you are quitting. Support from your loved ones can make quitting easier. Talk with your health care provider about your options for quitting smoking. Find out what treatment options are covered by your health insurance. Patient will be able to demonstrate knowledge of tobacco cessation strategies that may maximize success Quitting "cold [place]" is more successful than gradually quitting. Attending in-person counseling to help you build problem-solving skills.  Finding resources and support systems that can help you to quit smoking and remain smoke-free after you quit. These resources are most helpful when you use them often. They can include: Online chats with a Veterinary surgeon. Telephone quitlines. Printed Materials engineer. Support groups or group counseling. Text messaging programs. Mobile phone applications. Taking medicines to help you quit smoking: Nicotine patches, gum, or lozenges. Nicotine inhalers or sprays. Non-nicotine medicine that is taken by mouth. Patient will note get discouraged if the process is difficult Over the next year, patient will stop smoking or using other forms of tobacco  Smoking cessation instruction/counseling  given:  counseled patient on the dangers of tobacco use, advised patient to stop smoking, and reviewed strategies to maximize success           This is a list of Health  Maintenance Items that are overdue or due now: Health Maintenance Due  Topic Date Due   DTaP/Tdap/Td (3 - Td or Tdap) 07/22/2022   Pneumococcal vaccine  Screening mammography Shingles vaccine Tetanus vaccine Bone density scan   Orders/Referrals Placed Today: Orders Placed This Encounter  Procedures   DEXAScan    Standing Status:   Future    Standing Expiration Date:   01/29/2024    Scheduling Instructions:     Please call patient to schedule.    Order Specific Question:   Reason for exam:    Answer:   post menopausal    Order Specific Question:   Preferred imaging location?    Answer:   [facility]   Mammogram 3D SCREEN BREAST BILATERAL    Standing Status:   Future    Standing Expiration Date:   01/29/2024    Scheduling Instructions:     Please call patient to schedule.    Order Specific Question:   Reason for Exam (SYMPTOM  OR DIAGNOSIS REQUIRED)    Answer:   breast cancer screening    Order Specific Question:   Preferred imaging location?    Answer:   [facility]   Cologuard   (Contact our referral department at [phone] if you have not spoken with someone about your referral appointment within the next 5 days)    Follow-up Plan Follow-up with Agapito Games, MD as planned Schedule tetanus vaccine and shingles vaccine at the pharmacy.  Pneumonia vaccine can be done in the office or at the pharmacy. Cologuard due in mid July.  Bone density and mammogram referral has been sent.  Medicare wellness visit in one year.  Patient will access AVS on my chart.     Health Maintenance, Female Adopting a healthy lifestyle and getting preventive care are important in promoting health and wellness. Ask your health care provider about: The right schedule for you to have regular tests and exams. Things you can do on your own to prevent diseases and keep yourself healthy. What should I know about diet, weight, and exercise? Eat a healthy diet  Eat  a diet that includes plenty of vegetables, fruits, low-fat dairy products, and lean protein. Do not eat a lot of foods that are high in solid fats, added sugars, or sodium. Maintain a healthy weight Body mass index (BMI) is used to identify weight problems. It estimates body fat based on height and weight. Your health care provider can help determine your BMI and help you achieve or maintain a healthy weight. Get regular exercise Get regular exercise. This is one of the most important things you can do for your health. Most adults should: Exercise for at least 150 minutes each week. The exercise should increase your heart rate and make you sweat (moderate-intensity exercise). Do strengthening exercises at least twice a week. This is in addition to the moderate-intensity exercise. Spend less time sitting. Even light physical activity can be beneficial. Watch cholesterol and blood lipids Have your blood tested for lipids and cholesterol at 68 years of age, then have this test every 5 years. Have your cholesterol levels checked more often if: Your lipid or cholesterol levels are high. You are older than 68 years of age. You are at high risk for heart disease.  What should I know about cancer screening? Depending on your health history and family history, you may need to have cancer screening at various ages. This may include screening for: Breast cancer. Cervical cancer. Colorectal cancer. Skin cancer. Lung cancer. What should I know about heart disease, diabetes, and high blood pressure? Blood pressure and heart disease High blood pressure causes heart disease and increases the risk of stroke. This is more likely to develop in people who have high blood pressure readings or are overweight. Have your blood pressure checked: Every 3-5 years if you are 47-62 years of age. Every year if you are 80 years old or older. Diabetes Have regular diabetes screenings. This checks your fasting blood  sugar level. Have the screening done: Once every three years after age 23 if you are at a normal weight and have a low risk for diabetes. More often and at a younger age if you are overweight or have a high risk for diabetes. What should I know about preventing infection? Hepatitis B If you have a higher risk for hepatitis B, you should be screened for this virus. Talk with your health care provider to find out if you are at risk for hepatitis B infection. Hepatitis C Testing is recommended for: Everyone born from [birth year] through 1966. Anyone with known risk factors for hepatitis C. Sexually transmitted infections (STIs) Get screened for STIs, including gonorrhea and chlamydia, if: You are sexually active and are younger than 68 years of age. You are older than 68 years of age and your health care provider tells you that you are at risk for this type of infection. Your sexual activity has changed since you were last screened, and you are at increased risk for chlamydia or gonorrhea. Ask your health care provider if you are at risk. Ask your health care provider about whether you are at high risk for HIV. Your health care provider may recommend a prescription medicine to help prevent HIV infection. If you choose to take medicine to prevent HIV, you should first get tested for HIV. You should then be tested every 3 months for as long as you are taking the medicine. Pregnancy If you are about to stop having your period (premenopausal) and you may become pregnant, seek counseling before you get pregnant. Take 400 to 800 micrograms (mcg) of folic acid every day if you become pregnant. Ask for birth control (contraception) if you want to prevent pregnancy. Osteoporosis and menopause Osteoporosis is a disease in which the bones lose minerals and strength with aging. This can result in bone fractures. If you are 57 years old or older, or if you are at risk for osteoporosis and fractures, ask your health  care provider if you should: Be screened for bone loss. Take a calcium or vitamin D supplement to lower your risk of fractures. Be given hormone replacement therapy (HRT) to treat symptoms of menopause. Follow these instructions at home: Alcohol use Do not drink alcohol if: Your health care provider tells you not to drink. You are pregnant, may be pregnant, or are planning to become pregnant. If you drink alcohol: Limit how much you have to: 0-1 drink a day. Know how much alcohol is in your drink. In the [place] one drink equals one 12 oz bottle of beer (355 mL), one 5 oz glass of wine (148 mL), or one 1 oz glass of hard liquor (44 mL). Lifestyle Do not use any products that contain nicotine or tobacco. These products include cigarettes,  chewing tobacco, and vaping devices, such as e-cigarettes. If you need help quitting, ask your health care provider. Do not use street drugs. Do not share needles. Ask your health care provider for help if you need support or information about quitting drugs. General instructions Schedule regular health, dental, and eye exams. Stay current with your vaccines. Tell your health care provider if: You often feel depressed. You have ever been abused or do not feel safe at home. Summary Adopting a healthy lifestyle and getting preventive care are important in promoting health and wellness. Follow your health care provider's instructions about healthy diet, exercising, and getting tested or screened for diseases. Follow your health care provider's instructions on monitoring your cholesterol and blood pressure. This information is not intended to replace advice given to you by your health care provider. Make sure you discuss any questions you have with your health care provider. Document Revised: 02/14/2021 Document Reviewed: 02/14/2021 Elsevier Patient Education  2023 [employer].

## 2023-02-11 ENCOUNTER — Other Ambulatory Visit: Payer: Self-pay | Admitting: Acute Care

## 2023-02-11 DIAGNOSIS — Z122 Encounter for screening for malignant neoplasm of respiratory organs: Secondary | ICD-10-CM

## 2023-02-11 DIAGNOSIS — Z87891 Personal history of nicotine dependence: Secondary | ICD-10-CM

## 2023-02-11 DIAGNOSIS — F17210 Nicotine dependence, cigarettes, uncomplicated: Secondary | ICD-10-CM

## 2023-02-14 ENCOUNTER — Other Ambulatory Visit: Payer: Self-pay | Admitting: Family Medicine

## 2023-02-14 DIAGNOSIS — I10 Essential (primary) hypertension: Secondary | ICD-10-CM

## 2023-02-21 ENCOUNTER — Ambulatory Visit (INDEPENDENT_AMBULATORY_CARE_PROVIDER_SITE_OTHER): Payer: Medicare Other

## 2023-02-21 DIAGNOSIS — Z0000 Encounter for general adult medical examination without abnormal findings: Secondary | ICD-10-CM

## 2023-02-21 DIAGNOSIS — Z1231 Encounter for screening mammogram for malignant neoplasm of breast: Secondary | ICD-10-CM | POA: Diagnosis not present

## 2023-02-22 ENCOUNTER — Ambulatory Visit (INDEPENDENT_AMBULATORY_CARE_PROVIDER_SITE_OTHER): Payer: Medicare Other | Admitting: Family Medicine

## 2023-02-22 ENCOUNTER — Encounter: Payer: Self-pay | Admitting: Family Medicine

## 2023-02-22 VITALS — BP 121/64 | HR 71 | Ht 65.0 in | Wt 159.0 lb

## 2023-02-22 DIAGNOSIS — E7800 Pure hypercholesterolemia, unspecified: Secondary | ICD-10-CM

## 2023-02-22 DIAGNOSIS — M170 Bilateral primary osteoarthritis of knee: Secondary | ICD-10-CM | POA: Diagnosis not present

## 2023-02-22 DIAGNOSIS — F411 Generalized anxiety disorder: Secondary | ICD-10-CM

## 2023-02-22 DIAGNOSIS — R7301 Impaired fasting glucose: Secondary | ICD-10-CM | POA: Diagnosis not present

## 2023-02-22 DIAGNOSIS — I10 Essential (primary) hypertension: Secondary | ICD-10-CM

## 2023-02-22 LAB — POCT GLYCOSYLATED HEMOGLOBIN (HGB A1C): Hemoglobin A1C: 5.7 % — AB (ref 4.0–5.6)

## 2023-02-22 MED ORDER — CELECOXIB 100 MG PO CAPS
100.0000 mg | ORAL_CAPSULE | Freq: Every day | ORAL | 0 refills | Status: DC
Start: 2023-02-22 — End: 2023-02-22

## 2023-02-22 MED ORDER — CELECOXIB 100 MG PO CAPS: 100.0000 mg | ORAL_CAPSULE | Freq: Every day | ORAL | 0 refills | Status: DC

## 2023-02-22 NOTE — Progress Notes (Signed)
Established Patient Office Visit  Subjective   Patient ID: Valerie Carroll, female    DOB: [birth year]  Age: 67 y.o. MRN: [id number]  Chief Complaint  Patient presents with   mood   ifg    HPI  Hypertension- Pt denies chest pain, SOB, dizziness, or heart palpitations.  Taking meds as directed w/o problems.  Denies medication side effects.    Impaired fasting glucose-no increased thirst or urination. No symptoms consistent with hypoglycemia.  Hyperlipidemia - tolerating stating well with no myalgias or significant side effects.  Lab Results  Component Value Date   CHOL 176 07/13/2021   HDL 48 (L) 07/13/2021   LDLCALC 97 07/13/2021   LDLDIRECT 140 (H) 03/30/2010   TRIG 213 (H) 07/13/2021   CHOLHDL 3.7 07/13/2021        ROS    Objective:     BP 121/64   Pulse 71   Ht 5\' 5"  (1.651 m)   Wt 159 lb (72.1 kg)   SpO2 98%   BMI 26.46 kg/m    Physical Exam Vitals and nursing note reviewed.  Constitutional:      Appearance: She is well-developed.  HENT:     Head: Normocephalic and atraumatic.  Cardiovascular:     Rate and Rhythm: Normal rate and regular rhythm.     Heart sounds: Normal heart sounds.  Pulmonary:     Effort: Pulmonary effort is normal.     Breath sounds: Normal breath sounds.  Skin:    General: Skin is warm and dry.  Neurological:     Mental Status: She is alert and oriented to person, place, and time.  Psychiatric:        Behavior: Behavior normal.      Results for orders placed or performed in visit on 02/22/23  POCT glycosylated hemoglobin (Hb A1C)  Result Value Ref Range   Hemoglobin A1C 5.7 (A) 4.0 - 5.6 %   HbA1c POC (<> result, manual entry)     HbA1c, POC (prediabetic range)     HbA1c, POC (controlled diabetic range)        The 10-year ASCVD risk score (Arnett DK, et al., 2019) is: 14%    Assessment & Plan:   Problem List Items Addressed This Visit       Cardiovascular and Mediastinum   Essential hypertension, benign -  Primary    Well controlled. Continue current regimen. Follow up in  6 mo       Relevant Orders   COMPLETE METABOLIC PANEL WITH GFR   Lipid Panel w/reflex Direct LDL     Endocrine   IFG (impaired fasting glucose)    Well controlled. Continue current regimen. Follow up in  39mo       Relevant Orders   COMPLETE METABOLIC PANEL WITH GFR   Lipid Panel w/reflex Direct LDL   POCT glycosylated hemoglobin (Hb A1C) (Completed)     Musculoskeletal and Integument   Primary osteoarthritis of both knees    Doesn't like how she feels on the celebrex 200mg  so will change to the 100mg  dose.       Relevant Medications   celecoxib (CELEBREX) 100 MG capsule     Other   Hyperlipidemia   Relevant Orders   COMPLETE METABOLIC PANEL WITH GFR   Lipid Panel w/reflex Direct LDL   Generalized anxiety disorder    Continue with lexapro. Happy with current regimen.        Return in about 6 months (around 08/25/2023) for Pre-diabetes.  Tranise Forrest, MD  

## 2023-02-22 NOTE — Assessment & Plan Note (Signed)
Continue with lexapro. Happy with current regimen.

## 2023-02-22 NOTE — Assessment & Plan Note (Signed)
Well controlled. Continue current regimen. Follow up in  6 mo  

## 2023-02-22 NOTE — Assessment & Plan Note (Signed)
Doesn't like how she feels on the celebrex 200mg  so will change to the 100mg  dose.

## 2023-02-23 LAB — COMPLETE METABOLIC PANEL WITH GFR
AG Ratio: 1.8 (calc) (ref 1.0–2.5)
ALT: 30 U/L — ABNORMAL HIGH (ref 6–29)
AST: 27 U/L (ref 10–35)
Albumin: 4.4 g/dL (ref 3.6–5.1)
Alkaline phosphatase (APISO): 55 U/L (ref 37–153)
BUN: 12 mg/dL (ref 7–25)
CO2: 30 mmol/L (ref 20–32)
Calcium: 9.1 mg/dL (ref 8.6–10.4)
Chloride: 102 mmol/L (ref 98–110)
Creat: 0.59 mg/dL (ref 0.50–1.05)
Globulin: 2.4 g/dL (calc) (ref 1.9–3.7)
Glucose, Bld: 98 mg/dL (ref 65–99)
Potassium: 4.2 mmol/L (ref 3.5–5.3)
Sodium: 140 mmol/L (ref 135–146)
Total Bilirubin: 0.4 mg/dL (ref 0.2–1.2)
Total Protein: 6.8 g/dL (ref 6.1–8.1)
eGFR: 99 mL/min/{1.73_m2} (ref >=60)

## 2023-02-23 LAB — LIPID PANEL W/REFLEX DIRECT LDL
Cholesterol: 170 mg/dL (ref <200)
HDL: 53 mg/dL (ref >=50)
LDL Cholesterol (Calc): 92 mg/dL (calc)
Non-HDL Cholesterol (Calc): 117 mg/dL (calc) (ref <130)
Total CHOL/HDL Ratio: 3.2 (calc) (ref <5.0)
Triglycerides: 153 mg/dL — ABNORMAL HIGH (ref <150)

## 2023-02-23 NOTE — Progress Notes (Signed)
Please call patient. Normal mammogram.  Repeat in 1 year.  

## 2023-02-23 NOTE — Progress Notes (Signed)
Valerie Carroll, overall metabolic panel looks good except that ALT liver enzyme is just slightly elevated similar to what it is done before not in a worrisome range is only 1 point off.  Will continue to keep an eye on it and plan to recheck liver in 6 months.  LDL cholesterol is under 100 which is good.  Triglycerides actually look better this year compared to the last couple years so that is great as well.  Continue to work on healthy diet and regular exercise.  Also let us know if you end up getting your Tdap updated at the local pharmacy and we will get that added into your chart.

## 2023-03-09 ENCOUNTER — Ambulatory Visit: Payer: Medicare Other | Admitting: Sports Medicine

## 2023-03-26 ENCOUNTER — Ambulatory Visit: Payer: Medicare Other

## 2023-03-29 ENCOUNTER — Ambulatory Visit (INDEPENDENT_AMBULATORY_CARE_PROVIDER_SITE_OTHER): Payer: Medicare Other

## 2023-03-29 DIAGNOSIS — F17210 Nicotine dependence, cigarettes, uncomplicated: Secondary | ICD-10-CM

## 2023-03-29 DIAGNOSIS — Z122 Encounter for screening for malignant neoplasm of respiratory organs: Secondary | ICD-10-CM

## 2023-03-29 DIAGNOSIS — Z87891 Personal history of nicotine dependence: Secondary | ICD-10-CM

## 2023-04-06 ENCOUNTER — Ambulatory Visit (INDEPENDENT_AMBULATORY_CARE_PROVIDER_SITE_OTHER): Payer: Medicare Other

## 2023-04-06 ENCOUNTER — Ambulatory Visit (INDEPENDENT_AMBULATORY_CARE_PROVIDER_SITE_OTHER): Payer: Medicare Other | Admitting: Sports Medicine

## 2023-04-06 DIAGNOSIS — M19012 Primary osteoarthritis, left shoulder: Secondary | ICD-10-CM

## 2023-04-06 DIAGNOSIS — M19011 Primary osteoarthritis, right shoulder: Secondary | ICD-10-CM

## 2023-04-06 NOTE — Assessment & Plan Note (Signed)
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 She has responded well to glenohumeral joint injections with ultrasound guidance, the last of which was about 2 and half months ago. She is having recurrence of pain, we injected her glenohumeral joint today, we will get updated x-rays and MRI and considering the lack of sufficient duration of relief with the last injection I would like her to consult with Dr. Everardo Pacific regarding arthroscopic intervention versus arthroplasty.

## 2023-04-06 NOTE — Progress Notes (Signed)
    Procedures performed today:    Procedure: Real-time Ultrasound Guided injection of the right glenohumeral joint Device: Samsung HS60  Verbal informed consent obtained.  Time-out conducted.  Noted no overlying erythema, induration, or other signs of local infection.  Skin prepped in a sterile fashion.  Local anesthesia: Topical Ethyl chloride.  With sterile technique and under real time ultrasound guidance: Arthritic joint noted, 1 cc Kenalog 40, 2 cc lidocaine, 2 cc bupivacaine injected easily Completed without difficulty  Advised to call if fevers/chills, erythema, induration, drainage, or persistent bleeding.  Images permanently stored and available for review in PACS.  Impression: Technically successful ultrasound guided injection.  Independent interpretation of notes and tests performed by another provider:   None.  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 She has responded well to glenohumeral joint injections with ultrasound guidance, the last of which was about 2 and half months ago. She is having recurrence of pain, we injected her glenohumeral joint today, we will get updated x-rays and MRI and considering the lack of sufficient duration of relief with the last injection I would like her to consult with Dr. Everardo Pacific regarding arthroscopic intervention versus arthroplasty.    ____________________________________________ Ihor Austin. Benjamin Stain, M.D., ABFM., CAQSM., AME. Primary Care and Sports Medicine [facility]  Adjunct Professor of Family Medicine  [place] [employer]  Restaurant manager, fast food

## 2023-04-08 ENCOUNTER — Other Ambulatory Visit: Payer: Self-pay | Admitting: Family Medicine

## 2023-04-08 DIAGNOSIS — M170 Bilateral primary osteoarthritis of knee: Secondary | ICD-10-CM

## 2023-04-09 ENCOUNTER — Ambulatory Visit: Payer: Medicare Other | Admitting: Sports Medicine

## 2023-04-10 ENCOUNTER — Ambulatory Visit (INDEPENDENT_AMBULATORY_CARE_PROVIDER_SITE_OTHER): Payer: Medicare Other

## 2023-04-10 DIAGNOSIS — M19011 Primary osteoarthritis, right shoulder: Secondary | ICD-10-CM | POA: Diagnosis not present

## 2023-04-15 ENCOUNTER — Other Ambulatory Visit: Payer: Self-pay | Admitting: Family Medicine

## 2023-04-15 DIAGNOSIS — E785 Hyperlipidemia, unspecified: Secondary | ICD-10-CM

## 2023-05-03 ENCOUNTER — Telehealth: Payer: Self-pay | Admitting: Acute Care

## 2023-05-03 ENCOUNTER — Other Ambulatory Visit: Payer: Self-pay

## 2023-05-03 DIAGNOSIS — Z87891 Personal history of nicotine dependence: Secondary | ICD-10-CM

## 2023-05-03 DIAGNOSIS — R918 Other nonspecific abnormal finding of lung field: Secondary | ICD-10-CM

## 2023-05-03 NOTE — Telephone Encounter (Signed)
Pt. Has returned my call . We have reviewed the findings of her low dose Ct Chest. I explained that She has a new 17.1 mm GG nodule She has a growing 11.7 mm >> 13.1 mm GG nodule She has a new 12.4 mm GG nodule She has a growing solid nodule that has grown from 6 mm-6.9 mm in the last 12 months.   While the scan was read as a LR 2, I will do a 6 month follow up scan as there are 2 nodules that are increasing in size, and 2  new GG nodules to determine behavior.   She is in agreement with this plan.  Please fax results to PCP and  let them know plan for 6 month follow up due end of December . She is followed by cardiology and she is taking a statin.  Thanks so much

## 2023-05-03 NOTE — Telephone Encounter (Signed)
I have attempted to call the patient with the results of their  Low Dose CT Chest Lung cancer screening scan. There was no answer. I have left a HIPPA compliant VM requesting the patient call the office for the scan results. I included the office contact information in the message. We will await his return call. If no return call we will continue to call until patient is contacted.    Patient scan was read as a LR 2.  She has a new 17.1 mm GG nodule She has a growing 11.7 mm >> 13.1 mm GG nodule She has a new 12.4 mm GG nodule She has a growing solid nodule that has grown from 6 mm-6.9 mm in the last 12 months.  While the scan was read as a LR 2, I will do a 6 moth follow up scan as there are 2 nodules that look like they are increasing in size, and new GG nodules to determine behavior.   D, D, E, patient will need a 6 month follow up LDCT. Due 09/2023. Once we give her the results and plan we can fax results to PCP. Thanks so much

## 2023-05-03 NOTE — Telephone Encounter (Signed)
New order placed for nodule follow up LDCT in 6 months.  Results/plan faxed to PCP

## 2023-05-03 NOTE — Telephone Encounter (Signed)
PT ret [name] call. Please try again.

## 2023-05-04 ENCOUNTER — Other Ambulatory Visit: Payer: Self-pay | Admitting: Family Medicine

## 2023-05-04 DIAGNOSIS — F411 Generalized anxiety disorder: Secondary | ICD-10-CM

## 2023-05-04 NOTE — Telephone Encounter (Signed)
See other phone note

## 2023-05-07 NOTE — Progress Notes (Signed)
Great news! Your Cologuard test is negative.  Recommend repeat colon cancer screening in 3 years.

## 2023-07-12 ENCOUNTER — Ambulatory Visit (INDEPENDENT_AMBULATORY_CARE_PROVIDER_SITE_OTHER): Payer: Medicare Other | Admitting: Sports Medicine

## 2023-07-12 DIAGNOSIS — M19011 Primary osteoarthritis, right shoulder: Secondary | ICD-10-CM

## 2023-07-12 DIAGNOSIS — M19012 Primary osteoarthritis, left shoulder: Secondary | ICD-10-CM | POA: Diagnosis not present

## 2023-07-12 NOTE — Assessment & Plan Note (Signed)
Bilateral shoulder osteoarthritis, she has had some glenohumeral joint injections, the last of which was in June. She was referred to Dr. Everardo Pacific and she does have surgery coming up. No additional injections needed at this juncture.

## 2023-07-12 NOTE — Progress Notes (Signed)
    Procedures performed today:    None.  Independent interpretation of notes and tests performed by another provider:   None.  Brief History, Exam, Impression, and Recommendations:    Primary osteoarthritis of both shoulders Bilateral shoulder osteoarthritis, she has had some glenohumeral joint injections, the last of which was in June. She was referred to Dr. Everardo Pacific and she does have surgery coming up. No additional injections needed at this juncture.    ____________________________________________ Ihor Austin. Benjamin Stain, M.D., ABFM., CAQSM., AME. Primary Care and Sports Medicine [facility]  Adjunct Professor of Family Medicine  [place] [employer] [facility] [employer]  Restaurant manager, fast food

## 2023-08-10 ENCOUNTER — Encounter: Payer: Self-pay | Admitting: Family Medicine

## 2023-08-10 ENCOUNTER — Ambulatory Visit (INDEPENDENT_AMBULATORY_CARE_PROVIDER_SITE_OTHER): Payer: Medicare Other | Admitting: Family Medicine

## 2023-08-10 VITALS — BP 107/41 | HR 73 | Ht 65.0 in | Wt 163.0 lb

## 2023-08-10 DIAGNOSIS — M8580 Other specified disorders of bone density and structure, unspecified site: Secondary | ICD-10-CM | POA: Diagnosis not present

## 2023-08-10 DIAGNOSIS — F411 Generalized anxiety disorder: Secondary | ICD-10-CM | POA: Diagnosis not present

## 2023-08-10 DIAGNOSIS — R7301 Impaired fasting glucose: Secondary | ICD-10-CM | POA: Diagnosis not present

## 2023-08-10 DIAGNOSIS — Z780 Asymptomatic menopausal state: Secondary | ICD-10-CM

## 2023-08-10 DIAGNOSIS — I10 Essential (primary) hypertension: Secondary | ICD-10-CM

## 2023-08-10 LAB — POCT GLYCOSYLATED HEMOGLOBIN (HGB A1C): Hemoglobin A1C: 5.7 % — AB (ref 4.0–5.6)

## 2023-08-10 MED ORDER — LOSARTAN POTASSIUM-HCTZ 50-12.5 MG PO TABS
1.0000 | ORAL_TABLET | Freq: Every day | ORAL | 1 refills | Status: DC
Start: 2023-08-10 — End: 2024-02-04

## 2023-08-10 MED ORDER — ALPRAZOLAM 0.5 MG PO TABS
ORAL_TABLET | ORAL | 0 refills | Status: DC
Start: 2023-08-10 — End: 2023-11-05

## 2023-08-10 MED ORDER — ESCITALOPRAM OXALATE 20 MG PO TABS
20.0000 mg | ORAL_TABLET | Freq: Every day | ORAL | 1 refills | Status: DC
Start: 2023-08-10 — End: 2024-02-04

## 2023-08-10 NOTE — Assessment & Plan Note (Addendum)
Recheck T score. Has DEXA scheduleed.  Encouraged her to restart her calcium with vitamin D.

## 2023-08-10 NOTE — Assessment & Plan Note (Signed)
Happy with current regemin fo Lexapro.  Will refill.

## 2023-08-10 NOTE — Assessment & Plan Note (Signed)
A!C is stable at 5.7. continue to work on [employer] and exercise.

## 2023-08-10 NOTE — Assessment & Plan Note (Signed)
Well controlled. Continue current regimen. Follow up in  6 mo  

## 2023-08-10 NOTE — Progress Notes (Signed)
   Established Patient Office Visit  Subjective   Patient ID: Valerie Carroll, female    DOB: May 06, 1956  Age: 67 y.o. MRN: [id number]  Chief Complaint  Patient presents with   Hypertension    HPI   Hypertension- Pt denies chest pain, SOB, dizziness, or heart palpitations.  Taking meds as directed w/o problems.  Denies medication side effects.    Impaired fasting glucose-no increased thirst or urination. No symptoms consistent with hypoglycemia.  She just had right shoudler surgery and is still not able to use that shoulder. Hasn't started PT yet. Surgeon commented that her bones were really soft.      ROS    Objective:     BP (!) 107/41   Pulse 73   Ht 5\' 5"  (1.651 m)   Wt 163 lb (73.9 kg)   SpO2 95%   BMI 27.12 kg/m    BP Readings from Last 3 Encounters:  08/10/23 (!) 107/41  02/22/23 121/64  05/29/22 132/60     Physical Exam Vitals and nursing note reviewed.  Constitutional:      Appearance: Normal appearance.  HENT:     Head: Normocephalic and atraumatic.  Eyes:     Conjunctiva/sclera: Conjunctivae normal.  Cardiovascular:     Rate and Rhythm: Normal rate and regular rhythm.  Pulmonary:     Effort: Pulmonary effort is normal.     Breath sounds: Normal breath sounds.  Skin:    General: Skin is warm and dry.  Neurological:     Mental Status: She is alert.  Psychiatric:        Mood and Affect: Mood normal.      Results for orders placed or performed in visit on 08/10/23  POCT HgB A1C  Result Value Ref Range   Hemoglobin A1C 5.7 (A) 4.0 - 5.6 %   HbA1c POC (<> result, manual entry)     HbA1c, POC (prediabetic range)     HbA1c, POC (controlled diabetic range)        The 10-year ASCVD risk score (Arnett DK, et al., 2019) is: 11.6%    Assessment & Plan:   Problem List Items Addressed This Visit       Cardiovascular and Mediastinum   Essential hypertension, benign - Primary    Well controlled. Continue current regimen. Follow up in  36mo        Relevant Medications   losartan-hydrochlorothiazide (HYZAAR) 50-12.5 MG tablet   Other Relevant Orders   BMP8+EGFR     Endocrine   IFG (impaired fasting glucose)    A!C is stable at 5.7. continue to work on healthy diet and exercise.        Relevant Orders   POCT HgB A1C (Completed)     Musculoskeletal and Integument   Osteopenia after menopause    Recheck T score. Has DEXA scheduleed.  Encouraged her to restart her calcium with vitamin D.         Other   Generalized anxiety disorder    Happy with current regemin fo Lexapro.  Will refill.       Relevant Medications   ALPRAZolam (XANAX) 0.5 MG tablet   escitalopram (LEXAPRO) 20 MG tablet    Return in about 6 months (around 02/07/2024) for Hypertension, Pre-diabetes.    Nani Gasser, MD

## 2023-08-15 ENCOUNTER — Ambulatory Visit: Payer: Medicare Other

## 2023-08-15 DIAGNOSIS — Z13820 Encounter for screening for osteoporosis: Secondary | ICD-10-CM

## 2023-08-15 DIAGNOSIS — Z780 Asymptomatic menopausal state: Secondary | ICD-10-CM

## 2023-08-15 DIAGNOSIS — Z0000 Encounter for general adult medical examination without abnormal findings: Secondary | ICD-10-CM

## 2023-08-15 NOTE — Progress Notes (Signed)
  Your DEXA shows you have osteopenia The current recommendation for osteopenia (mildly thin bones) treatment includes:   #1 calcium-total of 1200 mg of calcium daily.  If you eat a very calcium rich diet you may be able to obtain that without a supplement.  If not, then I recommend calcium 500 mg twice a day.  There are several products over-the-counter such as Caltrate D and Viactiv chews which are great options that contain calcium and vitamin D. #2 vitamin D-recommend 800 international units daily. #3 exercise-recommend 30 minutes of weightbearing exercise 3 days a week.  Resistance training ,such as doing bands and light weights, can be particularly helpful.  I think with your soft bone we could still consider a bone builder like fosama or Boniva.

## 2023-09-28 ENCOUNTER — Ambulatory Visit: Payer: Medicare Other

## 2023-09-28 DIAGNOSIS — J439 Emphysema, unspecified: Secondary | ICD-10-CM

## 2023-09-28 DIAGNOSIS — Z87891 Personal history of nicotine dependence: Secondary | ICD-10-CM

## 2023-09-28 DIAGNOSIS — I700 Atherosclerosis of aorta: Secondary | ICD-10-CM | POA: Diagnosis not present

## 2023-09-28 DIAGNOSIS — R918 Other nonspecific abnormal finding of lung field: Secondary | ICD-10-CM

## 2023-09-29 LAB — BMP8+EGFR
BUN/Creatinine Ratio: 18 (ref 12–28)
BUN: 13 mg/dL (ref 8–27)
CO2: 25 mmol/L (ref 20–29)
Calcium: 9.8 mg/dL (ref 8.7–10.3)
Chloride: 99 mmol/L (ref 96–106)
Creatinine, Ser: 0.71 mg/dL (ref 0.57–1.00)
Glucose: 124 mg/dL — ABNORMAL HIGH (ref 70–99)
Potassium: 3.7 mmol/L (ref 3.5–5.2)
Sodium: 141 mmol/L (ref 134–144)
eGFR: 93 mL/min/{1.73_m2} (ref >59)

## 2023-09-29 NOTE — Progress Notes (Signed)
Your lab work is within acceptable range and there are no concerning findings.   ?

## 2023-10-29 ENCOUNTER — Encounter: Payer: Self-pay | Admitting: Family Medicine

## 2023-10-29 ENCOUNTER — Telehealth: Payer: Self-pay | Admitting: Acute Care

## 2023-10-29 DIAGNOSIS — I700 Atherosclerosis of aorta: Secondary | ICD-10-CM | POA: Insufficient documentation

## 2023-10-29 DIAGNOSIS — R911 Solitary pulmonary nodule: Secondary | ICD-10-CM | POA: Insufficient documentation

## 2023-10-29 NOTE — Telephone Encounter (Signed)
 Called and left Vm for pt.

## 2023-10-29 NOTE — Telephone Encounter (Signed)
Patient returned call. Informed her of the results and recommendations. Patient agreeable to plan. 6 month follow up scan order placed. PCP sent results.

## 2023-10-29 NOTE — Telephone Encounter (Signed)
Patient had a LDCT follow up scan on 12/20 which resulted as a LR2. There is a new 8.24mm nodule and growth of another non-solid nodule. After Sarah's review of the scan she would like patient to have a 6 month follow up scan to assure nodule stability. LDCT follow up scan due 03/28/2024.

## 2023-11-04 ENCOUNTER — Other Ambulatory Visit: Payer: Self-pay | Admitting: Family Medicine

## 2023-11-04 DIAGNOSIS — F411 Generalized anxiety disorder: Secondary | ICD-10-CM

## 2023-11-05 NOTE — Telephone Encounter (Signed)
Last OV and refill 08/10/2023 Next appt 02/04/2024

## 2024-01-30 ENCOUNTER — Other Ambulatory Visit: Payer: Self-pay | Admitting: Family Medicine

## 2024-01-30 DIAGNOSIS — Z1231 Encounter for screening mammogram for malignant neoplasm of breast: Secondary | ICD-10-CM

## 2024-01-31 ENCOUNTER — Other Ambulatory Visit: Payer: Self-pay | Admitting: Family Medicine

## 2024-01-31 DIAGNOSIS — E785 Hyperlipidemia, unspecified: Secondary | ICD-10-CM

## 2024-02-04 ENCOUNTER — Other Ambulatory Visit: Payer: Self-pay | Admitting: Family Medicine

## 2024-02-04 DIAGNOSIS — F411 Generalized anxiety disorder: Secondary | ICD-10-CM

## 2024-02-04 DIAGNOSIS — I10 Essential (primary) hypertension: Secondary | ICD-10-CM

## 2024-02-04 NOTE — Telephone Encounter (Signed)
 Call pt: due for 6 months follow up next month in May .please schedule   Meds ordered this encounter  Medications   escitalopram  (LEXAPRO ) 20 MG tablet    Sig: TAKE 1 TABLET BY MOUTH DAILY    Dispense:  90 tablet    Refill:  1   losartan -hydrochlorothiazide  (HYZAAR) 50-12.5 MG tablet    Sig: TAKE 1 TABLET BY MOUTH DAILY    Dispense:  90 tablet    Refill:  1   ALPRAZolam  (XANAX ) 0.5 MG tablet    Sig: TAKE A HALF TO ONE TABLET BY MOUTH TWO TIMES A DAY AS NEEDED FOR ANXIETY    Dispense:  60 tablet    Refill:  0

## 2024-02-04 NOTE — Telephone Encounter (Signed)
 Last refill 11/05/23 #60 No follow up appt scheduled.

## 2024-02-04 NOTE — Telephone Encounter (Signed)
 Patient scheduled for 03/05/24, thanks.

## 2024-02-21 ENCOUNTER — Other Ambulatory Visit: Payer: Self-pay | Admitting: Medical Genetics

## 2024-02-27 ENCOUNTER — Other Ambulatory Visit (HOSPITAL_COMMUNITY)
Admission: RE | Admit: 2024-02-27 | Discharge: 2024-02-27 | Disposition: A | Discharge status: Home / Self Care | Payer: Self-pay | Source: Ambulatory Visit | Attending: Medical Genetics | Admitting: Medical Genetics

## 2024-02-27 ENCOUNTER — Ambulatory Visit (INDEPENDENT_AMBULATORY_CARE_PROVIDER_SITE_OTHER)

## 2024-02-27 DIAGNOSIS — Z1231 Encounter for screening mammogram for malignant neoplasm of breast: Secondary | ICD-10-CM

## 2024-03-04 ENCOUNTER — Ambulatory Visit: Payer: Self-pay | Admitting: Family Medicine

## 2024-03-04 NOTE — Progress Notes (Signed)
 Please call patient. Normal mammogram.  Repeat in 1 year.

## 2024-03-05 ENCOUNTER — Ambulatory Visit (INDEPENDENT_AMBULATORY_CARE_PROVIDER_SITE_OTHER): Admitting: Family Medicine

## 2024-03-05 ENCOUNTER — Encounter: Payer: Self-pay | Admitting: Family Medicine

## 2024-03-05 VITALS — BP 123/62 | HR 98 | Ht 65.0 in | Wt 164.0 lb

## 2024-03-05 DIAGNOSIS — F17200 Nicotine dependence, unspecified, uncomplicated: Secondary | ICD-10-CM

## 2024-03-05 DIAGNOSIS — F411 Generalized anxiety disorder: Secondary | ICD-10-CM

## 2024-03-05 DIAGNOSIS — R7301 Impaired fasting glucose: Secondary | ICD-10-CM | POA: Diagnosis not present

## 2024-03-05 DIAGNOSIS — I10 Essential (primary) hypertension: Secondary | ICD-10-CM

## 2024-03-05 LAB — POCT GLYCOSYLATED HEMOGLOBIN (HGB A1C): Hemoglobin A1C: 5.6 % (ref 4.0–5.6)

## 2024-03-05 NOTE — Assessment & Plan Note (Signed)
 A1c looks great today at 5.6 fantastic job.

## 2024-03-05 NOTE — Progress Notes (Signed)
   Established Patient Office Visit  Subjective  Patient ID: Valerie Carroll, female    DOB: July 09, 1956  Age: 68 y.o. MRN: [id number]  Chief Complaint  Patient presents with   Hypertension    HPI Of her since her shoulder surgery last fall she just has not felt motivated she has not gotten back on track with being active and exercising.  Hypertension- Pt denies chest pain, SOB, dizziness, or heart palpitations.  Taking meds as directed w/o problems.  Denies medication side effects.      ROS    Objective:     BP 123/62   Pulse 98   Ht 5\' 5"  (1.651 m)   Wt 164 lb (74.4 kg)   SpO2 97%   BMI 27.29 kg/m    Physical Exam Vitals and nursing note reviewed.  Constitutional:      Appearance: Normal appearance.  HENT:     Head: Normocephalic and atraumatic.  Eyes:     Conjunctiva/sclera: Conjunctivae normal.  Cardiovascular:     Rate and Rhythm: Normal rate and regular rhythm.  Pulmonary:     Effort: Pulmonary effort is normal.     Breath sounds: Normal breath sounds.  Skin:    General: Skin is warm and dry.  Neurological:     Mental Status: She is alert.  Psychiatric:        Mood and Affect: Mood normal.    Results for orders placed or performed in visit on 03/05/24  POCT HgB A1C  Result Value Ref Range   Hemoglobin A1C 5.6 4.0 - 5.6 %   HbA1c POC (<> result, manual entry)     HbA1c, POC (prediabetic range)     HbA1c, POC (controlled diabetic range)        The 10-year ASCVD risk score (Arnett DK, et al., 2019) is: 15%    Assessment & Plan:   Problem List Items Addressed This Visit       Cardiovascular and Mediastinum   Essential hypertension, benign   Pressure looks perfect today.  Continue current regimen.  Due for updated labs.      Relevant Orders   CMP14+EGFR   Lipid panel   CBC     Endocrine   IFG (impaired fasting glucose) - Primary   A1c looks great today at 5.6 fantastic job.      Relevant Orders   POCT HgB A1C (Completed)    CMP14+EGFR   Lipid panel   CBC     Other   TOBACCO DEPENDENCE   She is really thinking about quitting smoking again and she is interested in hypnotism she was given some and some information about a provider in [place] and is looking into that.  She has tried the patch etc. in the past.      Generalized anxiety disorder   She has a few mild depressive symptoms and anxiety symptoms.  Just feels like she cannot quite get motivated since her surgery.  Encouraged her to start with just walking her dog and doing simple things or just getting outside for 5 minutes and setting really small goals to move forward also encouraged her to consider therapy/counseling if she feels like that would be helpful.       Return in about 6 months (around 09/05/2024) for Hypertension, Pre-diabetes.    Duaine German, MD

## 2024-03-05 NOTE — Assessment & Plan Note (Signed)
 She has a few mild depressive symptoms and anxiety symptoms.  Just feels like she cannot quite get motivated since her surgery.  Encouraged her to start with just walking her dog and doing simple things or just getting outside for 5 minutes and setting really small goals to move forward also encouraged her to consider therapy/counseling if she feels like that would be helpful.

## 2024-03-05 NOTE — Assessment & Plan Note (Signed)
 She is really thinking about quitting smoking again and she is interested in hypnotism she was given some and some information about a provider in [place] and is looking into that.  She has tried the patch etc. in the past.

## 2024-03-05 NOTE — Assessment & Plan Note (Signed)
 Pressure looks perfect today.  Continue current regimen.  Due for updated labs.

## 2024-03-06 ENCOUNTER — Ambulatory Visit: Payer: Self-pay | Admitting: Family Medicine

## 2024-03-06 LAB — CBC
Hematocrit: 43.6 % (ref 34.0–46.6)
Hemoglobin: 14.5 g/dL (ref 11.1–15.9)
MCH: 32.1 pg (ref 26.6–33.0)
MCHC: 33.3 g/dL (ref 31.5–35.7)
MCV: 97 fL (ref 79–97)
Platelets: 266 10*3/uL (ref 150–450)
RBC: 4.52 x10E6/uL (ref 3.77–5.28)
RDW: 12.4 % (ref 11.7–15.4)
WBC: 6.2 10*3/uL (ref 3.4–10.8)

## 2024-03-06 LAB — CMP14+EGFR
ALT: 30 IU/L (ref 0–32)
AST: 28 IU/L (ref 0–40)
Albumin: 4.3 g/dL (ref 3.9–4.9)
Alkaline Phosphatase: 74 IU/L (ref 44–121)
BUN/Creatinine Ratio: 13 (ref 12–28)
BUN: 10 mg/dL (ref 8–27)
Bilirubin Total: 0.2 mg/dL (ref 0.0–1.2)
CO2: 23 mmol/L (ref 20–29)
Calcium: 9.4 mg/dL (ref 8.7–10.3)
Chloride: 100 mmol/L (ref 96–106)
Creatinine, Ser: 0.76 mg/dL (ref 0.57–1.00)
Globulin, Total: 2.5 g/dL (ref 1.5–4.5)
Glucose: 112 mg/dL — ABNORMAL HIGH (ref 70–99)
Potassium: 3.8 mmol/L (ref 3.5–5.2)
Sodium: 140 mmol/L (ref 134–144)
Total Protein: 6.8 g/dL (ref 6.0–8.5)
eGFR: 85 mL/min/{1.73_m2} (ref >59)

## 2024-03-06 LAB — LIPID PANEL
Chol/HDL Ratio: 4.1 ratio (ref 0.0–4.4)
Cholesterol, Total: 182 mg/dL (ref 100–199)
HDL: 44 mg/dL (ref >39)
LDL Chol Calc (NIH): 107 mg/dL — ABNORMAL HIGH (ref 0–99)
Triglycerides: 175 mg/dL — ABNORMAL HIGH (ref 0–149)
VLDL Cholesterol Cal: 31 mg/dL (ref 5–40)

## 2024-03-06 NOTE — Progress Notes (Signed)
 Hi Kathy, metabolic panel overall looks good.  Triglycerides are up a little bit similar to last year.  And LDL is up just a little bit as well.  I would like to make an adjustment to your simvastatin  and switch you to a statin that is just a little bit more powerful to get your LDL more ideal.  If you are okay with this change please let me know.  Blood counts normal no sign of anemia.  The 10-year ASCVD risk score (Arnett DK, et al., 2019) is: 16.2%   Values used to calculate the score:     Age: 19 years     Sex: Female     Is Non-Hispanic African American: No     Diabetic: No     Tobacco smoker: Yes     Systolic Blood Pressure: 123 mmHg     Is BP treated: Yes     HDL Cholesterol: 44 mg/dL     Total Cholesterol: 182 mg/dL

## 2024-03-07 ENCOUNTER — Other Ambulatory Visit: Payer: Self-pay | Admitting: Family Medicine

## 2024-03-07 DIAGNOSIS — E7800 Pure hypercholesterolemia, unspecified: Secondary | ICD-10-CM

## 2024-03-07 LAB — GENECONNECT MOLECULAR SCREEN: Genetic Analysis Overall Interpretation: NEGATIVE

## 2024-03-07 MED ORDER — ROSUVASTATIN CALCIUM 40 MG PO TABS: 40.0000 mg | ORAL_TABLET | Freq: Every day | ORAL | 3 refills | Status: DC

## 2024-03-07 NOTE — Progress Notes (Signed)
 Meds ordered this encounter  Medications   rosuvastatin (CRESTOR) 40 MG tablet    Sig: Take 1 tablet (40 mg total) by mouth at bedtime.    Dispense:  100 tablet    Refill:  3

## 2024-03-07 NOTE — Progress Notes (Signed)
 Will do. Thank you

## 2024-04-01 ENCOUNTER — Ambulatory Visit

## 2024-04-01 DIAGNOSIS — R911 Solitary pulmonary nodule: Secondary | ICD-10-CM

## 2024-04-29 ENCOUNTER — Telehealth: Payer: Self-pay

## 2024-04-29 NOTE — Telephone Encounter (Signed)
 Called and left VM for pt

## 2024-04-29 NOTE — Telephone Encounter (Signed)
 Results have been reviewed by Ruthell, NP. Please review results below with patient. Advise patient that even though her results were read as a Rads 2, provider prefers patient complete a repeat scan in 6 months. Several nodules are waxing and waning. One nodule has grown, now 13.7 mm. She would like to evaluate closer for stability. Place order, scan will be due around 10/01/2024. Send results and plan to PCP.   IMPRESSION: 1. Lung-RADS 2, benign appearance or behavior. Continue annual screening with low-dose chest CT without contrast in 12 months. 2. Coronary artery calcifications. 3. Gallstones. 4.  Aortic Atherosclerosis (ICD10-I70.0).

## 2024-04-30 ENCOUNTER — Other Ambulatory Visit: Payer: Self-pay

## 2024-04-30 DIAGNOSIS — Z122 Encounter for screening for malignant neoplasm of respiratory organs: Secondary | ICD-10-CM

## 2024-04-30 DIAGNOSIS — R911 Solitary pulmonary nodule: Secondary | ICD-10-CM

## 2024-04-30 DIAGNOSIS — R918 Other nonspecific abnormal finding of lung field: Secondary | ICD-10-CM

## 2024-04-30 DIAGNOSIS — Z87891 Personal history of nicotine dependence: Secondary | ICD-10-CM

## 2024-04-30 NOTE — Telephone Encounter (Signed)
 Spoke with patient and reviewed results below. She is in agreement to repeat scan in 6 months. Scheduled for 10/06/2024. Order placed. Results and plan to PCP. NFN.

## 2024-05-06 ENCOUNTER — Telehealth: Admitting: Physician Assistant

## 2024-05-06 DIAGNOSIS — J069 Acute upper respiratory infection, unspecified: Secondary | ICD-10-CM

## 2024-05-06 DIAGNOSIS — B9689 Other specified bacterial agents as the cause of diseases classified elsewhere: Secondary | ICD-10-CM | POA: Diagnosis not present

## 2024-05-06 MED ORDER — DOXYCYCLINE HYCLATE 100 MG PO TABS: 100.0000 mg | ORAL_TABLET | Freq: Two times a day (BID) | ORAL | 0 refills | Status: DC

## 2024-05-06 MED ORDER — PROMETHAZINE-DM 6.25-15 MG/5ML PO SYRP: 5.0000 mL | ORAL_SOLUTION | Freq: Four times a day (QID) | ORAL | 0 refills | Status: DC | PRN

## 2024-05-06 NOTE — Progress Notes (Signed)
 Virtual Visit Consent   Valerie Carroll, you are scheduled for a virtual visit with a [facility] provider today. Just as with appointments in the office, your consent must be obtained to participate. Your consent will be active for this visit and any virtual visit you may have with one of our providers in the next 365 days. If you have a MyChart account, a copy of this consent can be sent to you electronically.  As this is a virtual visit, video technology does not allow for your provider to perform a traditional examination. This may limit your provider's ability to fully assess your condition. If your provider identifies any concerns that need to be evaluated in person or the need to arrange testing (such as labs, EKG, etc.), we will make arrangements to do so. Although advances in technology are sophisticated, we cannot ensure that it will always work on either your end or our end. If the connection with a video visit is poor, the visit may have to be switched to a telephone visit. With either a video or telephone visit, we are not always able to ensure that we have a secure connection.  By engaging in this virtual visit, you consent to the provision of healthcare and authorize for your insurance to be billed (if applicable) for the services provided during this visit. Depending on your insurance coverage, you may receive a charge related to this service.  I need to obtain your verbal consent now. Are you willing to proceed with your visit today? Aleenah Homen has provided verbal consent on 05/06/2024 for a virtual visit (video or telephone). Delon CHRISTELLA Dickinson, PA-C  Date: 05/06/2024 11:57 AM   Virtual Visit via Video Note   I, Delon CHRISTELLA Dickinson, connected with  Aixa Corsello  ([id number], Mar 05, 1956) on 05/06/24 at 11:45 AM EDT by a video-enabled telemedicine application and verified that I am speaking with the correct person using two identifiers.  Location: Patient: Virtual Visit Location  Patient: Home Provider: Virtual Visit Location Provider: Home Office   I discussed the limitations of evaluation and management by telemedicine and the availability of in person appointments. The patient expressed understanding and agreed to proceed.    History of Present Illness: Valerie Carroll is a 68 y.o. who identifies as a female who was assigned female at birth, and is being seen today for sinus congestion and cough.  HPI: URI  This is a new problem. The current episode started 1 to 4 weeks ago (3 weeks). The problem has been gradually worsening. There has been no fever. Associated symptoms include congestion, coughing, headaches, rhinorrhea and sinus pain. Pertinent negatives include no diarrhea, ear pain, nausea, plugged ear sensation, sore throat, vomiting or wheezing. Treatments tried: flonase, sinus congestion, nyquil. The treatment provided no relief.     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 11/10/2020   Lumbago 12/26/2016   Myofascial pain syndrome 07/26/2012   Cervical radiculopathy at C8 06/26/2012   Hyperlipidemia 03/30/2010   INSOMNIA 06/04/2009   IFG (impaired fasting glucose) 07/16/2008   FATIGUE 07/14/2008   Asymptomatic postmenopausal status 07/14/2008   Essential hypertension, benign  08/22/2007   Generalized anxiety disorder 07/17/2006   TOBACCO DEPENDENCE 07/17/2006    Allergies:  Allergies  Allergen Reactions   Citalopram Hydrobromide Nausea Only   Effexor  [Venlafaxine ] Other (See Comments)    Brain zaps, and feeling mentally sluggish   Lisinopril  Cough   Sertraline  Other (See Comments)     Headaches and brain zaps   Medications:  Current Outpatient Medications:    doxycycline  (VIBRA -TABS) 100 MG tablet, Take 1 tablet (100 mg total) by mouth 2 (two) times daily., Disp: 28 tablet, Rfl: 0   promethazine -dextromethorphan (PROMETHAZINE -DM) 6.25-15 MG/5ML syrup, Take 5 mLs by mouth 4 (four) times daily as needed., Disp: 118 mL, Rfl: 0   ALPRAZolam  (XANAX ) 0.5 MG tablet, TAKE A HALF TO ONE TABLET BY MOUTH TWO TIMES A DAY AS NEEDED FOR ANXIETY, Disp: 60 tablet, Rfl: 0   escitalopram  (LEXAPRO ) 20 MG tablet, TAKE 1 TABLET BY MOUTH DAILY, Disp: 90 tablet, Rfl: 1   losartan -hydrochlorothiazide  (HYZAAR) 50-12.5 MG tablet, TAKE 1 TABLET BY MOUTH DAILY, Disp: 90 tablet, Rfl: 1   rosuvastatin  (CRESTOR ) 40 MG tablet, Take 1 tablet (40 mg total) by mouth at bedtime., Disp: 100 tablet, Rfl: 3  Observations/Objective: Patient is well-developed, well-nourished in no acute distress.  Resting comfortably  at home.  Head is normocephalic, atraumatic.  No labored breathing. Speech is clear and coherent with logical content.  Patient is alert and oriented at baseline.    Assessment and Plan: 1. Bacterial upper respiratory infection (Primary) - doxycycline  (VIBRA -TABS) 100 MG tablet; Take 1 tablet (100 mg total) by mouth 2 (two) times daily.  Dispense: 28 tablet; Refill: 0 - promethazine -dextromethorphan (PROMETHAZINE -DM) 6.25-15 MG/5ML syrup; Take 5 mLs by mouth 4 (four) times daily as needed.  Dispense: 118 mL; Refill: 0  - Worsening over a week despite OTC medications - Will treat with Doxycycline  x 14 days (has history of requiring prolonged treatments in the past) - Promethazine  DM for nighttime cough - Can add Mucinex  PLAIN - Continue Flonase - Push fluids.  - Rest.  - Steam and humidifier can help - Seek in person evaluation if worsening or symptoms fail to improve    Follow Up Instructions: I discussed the assessment and treatment plan with the patient. The patient was provided  an opportunity to ask questions and all were answered. The patient agreed with the plan and demonstrated an understanding of the instructions.  A copy of instructions were sent to the patient via MyChart unless otherwise noted below.    The patient was advised to call back or seek an in-person evaluation if the symptoms worsen or if the condition fails to improve as anticipated.    Delon CHRISTELLA Dickinson, PA-C

## 2024-05-06 NOTE — Patient Instructions (Signed)
 Rollo Haley, thank you for joining Delon CHRISTELLA Dickinson, PA-C for today's virtual visit.  While this provider is not your primary care provider (PCP), if your PCP is located in our provider database this encounter information will be shared with them immediately following your visit.   A [facility] MyChart account gives you access to today's visit and all your visits, tests, and labs performed at [facility]  click here if you don't have a [facility] MyChart account or go to [email or web address]  Consent: (Patient) Valerie Carroll provided verbal consent for this virtual visit at the beginning of the encounter.  Current Medications:  Current Outpatient Medications:    doxycycline  (VIBRA -TABS) 100 MG tablet, Take 1 tablet (100 mg total) by mouth 2 (two) times daily., Disp: 28 tablet, Rfl: 0   promethazine -dextromethorphan (PROMETHAZINE -DM) 6.25-15 MG/5ML syrup, Take 5 mLs by mouth 4 (four) times daily as needed., Disp: 118 mL, Rfl: 0   ALPRAZolam  (XANAX ) 0.5 MG tablet, TAKE A HALF TO ONE TABLET BY MOUTH TWO TIMES A DAY AS NEEDED FOR ANXIETY, Disp: 60 tablet, Rfl: 0   escitalopram  (LEXAPRO ) 20 MG tablet, TAKE 1 TABLET BY MOUTH DAILY, Disp: 90 tablet, Rfl: 1   losartan -hydrochlorothiazide  (HYZAAR) 50-12.5 MG tablet, TAKE 1 TABLET BY MOUTH DAILY, Disp: 90 tablet, Rfl: 1   rosuvastatin  (CRESTOR ) 40 MG tablet, Take 1 tablet (40 mg total) by mouth at bedtime., Disp: 100 tablet, Rfl: 3   Medications ordered in this encounter:  Meds ordered this encounter  Medications   doxycycline  (VIBRA -TABS) 100 MG tablet    Sig: Take 1 tablet (100 mg total) by mouth 2 (two) times daily.    Dispense:  28 tablet    Refill:  0    Supervising Provider:   LAMPTEY, PHILIP O [[id number]]   promethazine -dextromethorphan (PROMETHAZINE -DM) 6.25-15 MG/5ML syrup    Sig: Take 5 mLs by mouth 4 (four) times daily as needed.    Dispense:  118 mL    Refill:  0    Supervising Provider:   BLAISE ALEENE KIDD  [[id number]]     *If you need refills on other medications prior to your next appointment, please contact your pharmacy*  Follow-Up: Call back or seek an in-person evaluation if the symptoms worsen or if the condition fails to improve as anticipated.  [facility] [phone]  Other Instructions Upper Respiratory Infection, Adult An upper respiratory infection (URI) is a common viral infection of the nose, throat, and upper air passages that lead to the lungs. The most common type of URI is the common cold. URIs usually get better on their own, without medical treatment. What are the causes? A URI is caused by a virus. You may catch a virus by: Breathing in droplets from an infected person's cough or sneeze. Touching something that has been exposed to the virus (is contaminated) and then touching your mouth, nose, or eyes. What increases the risk? You are more likely to get a URI if: You are very young or very old. You have close contact with others, such as at work, school, or a health care facility. You smoke. You have long-term (chronic) heart or lung disease. You have a weakened disease-fighting system (immune system). You have nasal allergies or asthma. You are experiencing a lot of stress. You have poor nutrition. What are the signs or symptoms? A URI usually involves some of the following symptoms: Runny or stuffy (congested) nose. Cough. Sneezing. Sore throat. Headache. Fatigue. Fever. Loss of appetite. Pain  in your forehead, behind your eyes, and over your cheekbones (sinus pain). Muscle aches. Redness or irritation of the eyes. Pressure in the ears or face. How is this diagnosed? This condition may be diagnosed based on your medical history and symptoms, and a physical exam. Your health care provider may use a swab to take a mucus sample from your nose (nasal swab). This sample can be tested to determine what virus is causing the illness. How is this  treated? URIs usually get better on their own within 7-10 days. Medicines cannot cure URIs, but your health care provider may recommend certain medicines to help relieve symptoms, such as: Over-the-counter cold medicines. Cough suppressants. Coughing is a type of defense against infection that helps to clear the respiratory system, so take these medicines only as recommended by your health care provider. Fever-reducing medicines. Follow these instructions at home: Activity Rest as needed. If you have a fever, stay home from work or school until your fever is gone or until your health care provider says your URI cannot spread to other people (is no longer contagious). Your health care provider may have you wear a face mask to prevent your infection from spreading. Relieving symptoms Gargle with a mixture of salt and water 3-4 times a day or as needed. To make salt water, completely dissolve -1 tsp (3-6 g) of salt in 1 cup (237 mL) of warm water. Use a cool-mist humidifier to add moisture to the air. This can help you breathe more easily. Eating and drinking  Drink enough fluid to keep your urine pale yellow. Eat soups and other clear broths. General instructions  Take over-the-counter and prescription medicines only as told by your health care provider. These include cold medicines, fever reducers, and cough suppressants. Do not use any products that contain nicotine or tobacco. These products include cigarettes, chewing tobacco, and vaping devices, such as e-cigarettes. If you need help quitting, ask your health care provider. Stay away from secondhand smoke. Stay up to date on all immunizations, including the yearly (annual) flu vaccine. Keep all follow-up visits. This is important. How to prevent the spread of infection to others URIs can be contagious. To prevent the infection from spreading: Wash your hands with soap and water for at least 20 seconds. If soap and water are not  available, use hand sanitizer. Avoid touching your mouth, face, eyes, or nose. Cough or sneeze into a tissue or your sleeve or elbow instead of into your hand or into the air.  Contact a health care provider if: You are getting worse instead of better. You have a fever or chills. Your mucus is brown or red. You have yellow or brown discharge coming from your nose. You have pain in your face, especially when you bend forward. You have swollen neck glands. You have pain while swallowing. You have white areas in the back of your throat. Get help right away if: You have shortness of breath that gets worse. You have severe or persistent: Headache. Ear pain. Sinus pain. Chest pain. You have chronic lung disease along with any of the following: Making high-pitched whistling sounds when you breathe, most often when you breathe out (wheezing). Prolonged cough (more than 14 days). Coughing up blood. A change in your usual mucus. You have a stiff neck. You have changes in your: Vision. Hearing. Thinking. Mood. These symptoms may be an emergency. Get help right away. Call 911. Do not wait to see if the symptoms will go away. Do  not drive yourself to the hospital. Summary An upper respiratory infection (URI) is a common infection of the nose, throat, and upper air passages that lead to the lungs. A URI is caused by a virus. URIs usually get better on their own within 7-10 days. Medicines cannot cure URIs, but your health care provider may recommend certain medicines to help relieve symptoms. This information is not intended to replace advice given to you by your health care provider. Make sure you discuss any questions you have with your health care provider. Document Revised: 04/27/2021 Document Reviewed: 04/27/2021 Elsevier Patient Education  2024 Elsevier Inc.   If you have been instructed to have an in-person evaluation today at a local Urgent Care facility, please use the link  below. It will take you to a list of all of our available [facility], including address, phone number and hours of operation. Please do not delay care.  [facility]  If you or a family member do not have a primary care provider, use the link below to schedule a visit and establish care. When you choose a [facility] primary care physician or advanced practice provider, you gain a long-term partner in health. Find a Primary Care Provider  Learn more about [facility]'s in-office and virtual care options: [facility] Get Care Now

## 2024-06-10 ENCOUNTER — Encounter: Payer: Self-pay | Admitting: Sports Medicine

## 2024-06-19 ENCOUNTER — Ambulatory Visit (INDEPENDENT_AMBULATORY_CARE_PROVIDER_SITE_OTHER): Admitting: Family Medicine

## 2024-06-19 ENCOUNTER — Encounter: Payer: Self-pay | Admitting: Family Medicine

## 2024-06-19 VITALS — BP 119/71 | HR 65 | Ht 65.0 in | Wt 163.1 lb

## 2024-06-19 DIAGNOSIS — R252 Cramp and spasm: Secondary | ICD-10-CM | POA: Diagnosis not present

## 2024-06-19 DIAGNOSIS — R058 Other specified cough: Secondary | ICD-10-CM | POA: Diagnosis not present

## 2024-06-19 DIAGNOSIS — E7800 Pure hypercholesterolemia, unspecified: Secondary | ICD-10-CM | POA: Diagnosis not present

## 2024-06-19 DIAGNOSIS — T50905A Adverse effect of unspecified drugs, medicaments and biological substances, initial encounter: Secondary | ICD-10-CM

## 2024-06-19 DIAGNOSIS — R5383 Other fatigue: Secondary | ICD-10-CM | POA: Diagnosis not present

## 2024-06-19 MED ORDER — ATORVASTATIN CALCIUM 10 MG PO TABS: 10.0000 mg | ORAL_TABLET | Freq: Every day | ORAL | 0 refills | Status: DC

## 2024-06-19 NOTE — Progress Notes (Signed)
 Pt reports that since she started the Crestor  she has felt tired,bloated,she's been coughing and her voice is hoarse, and that every time she eats she has a BM

## 2024-06-19 NOTE — Progress Notes (Signed)
 Established Patient Office Visit  Subjective  Patient ID: Valerie Carroll, female    DOB: [birth year]  Age: 68 y.o. MRN: [id number]  Chief Complaint  Patient presents with   Medical Management of Chronic Issues    HPI  Discussed the use of AI scribe software for clinical note transcription with the patient, who gave verbal consent to proceed.  History of Present Illness Valerie Carroll is a 68 year old female who presents with side effects potentially related to Crestor  use.  Gastrointestinal symptoms - Loose bowel movements occur approximately 90% of the time after eating.  Fatigue - Significant fatigue necessitating daytime naps.  Cough - Dry cough that worsens at night, especially when lying down. - Sensation of cough originating from the throat. - no chest sxs.   Musculoskeletal symptoms - Leg cramps described as 'Charlie Horses.'  Recent upper respiratory infection - Experienced a severe sinus infection over the summer. - Treated with penicillin during a virtual visit with another physician, resulting in resolution of the infection. - Cough medicine was ineffective.      ROS    Objective:     BP 119/71   Pulse 65   Ht 5' 5 (1.651 m)   Wt 163 lb 1.9 oz (74 kg)   SpO2 97%   BMI 27.14 kg/m    Physical Exam Constitutional:      Appearance: Normal appearance.  HENT:     Head: Normocephalic and atraumatic.     Right Ear: Tympanic membrane, ear canal and external ear normal. There is no impacted cerumen.     Left Ear: Tympanic membrane, ear canal and external ear normal. There is no impacted cerumen.     Nose: Nose normal.     Mouth/Throat:     Pharynx: Oropharynx is clear.  Eyes:     Conjunctiva/sclera: Conjunctivae normal.  Cardiovascular:     Rate and Rhythm: Normal rate and regular rhythm.  Pulmonary:     Effort: Pulmonary effort is normal.     Breath sounds: Normal breath sounds.  Musculoskeletal:     Cervical back: Neck supple. No  tenderness.  Lymphadenopathy:     Cervical: No cervical adenopathy.  Skin:    General: Skin is warm and dry.  Neurological:     Mental Status: She is alert and oriented to person, place, and time.  Psychiatric:        Mood and Affect: Mood normal.      No results found for any visits on 06/19/24.    The 10-year ASCVD risk score (Arnett DK, et al., 2019) is: 16.4%    Assessment & Plan:   Problem List Items Addressed This Visit       Other   Hyperlipidemia   Relevant Medications   atorvastatin  (LIPITOR) 10 MG tablet   Other Visit Diagnoses       Medication side effect, initial encounter    -  Primary   Relevant Orders   CK Total (and CKMB)   CMP14+EGFR     Muscle cramping       Relevant Orders   CK Total (and CKMB)   CMP14+EGFR     Other fatigue         Dry cough         Assessment and Plan Assessment & Plan Suspected statin-induced adverse effects (myalgia, fatigue, diarrhea, cough) Symptoms of myalgia, fatigue, diarrhea, and cough coincide with Crestor  initiation. Likely dose-dependent statin side effects. Differential for cough includes reflux or drainage. -  Discontinue Crestor  for 7-10 days washout. - Order CK level to assess muscle enzyme elevation. - If symptoms improve, initiate different statin subclass at low dose. - Monitor symptoms and adjust statin dosage based on tolerance and LDL levels. - Evaluate for reflux or drainage if cough persists after washout.  Hypercholesterolemia Crestor  discontinued due to adverse effects. Plan to switch to a tolerable statin while managing LDL levels. - Initiate different statin subclass at low dose after washout. - Monitor LDL levels and adjust dosage to achieve target reduction. - Provide prescription for new statin post-washout.  Cough - if not resolving please let us  know. Consider GERD, Post nasal drip   Return if symptoms worsen or fail to improve.    [name], MD

## 2024-06-20 ENCOUNTER — Ambulatory Visit: Payer: Self-pay | Admitting: Family Medicine

## 2024-06-20 LAB — CMP14+EGFR
ALT: 34 IU/L — ABNORMAL HIGH (ref 0–32)
AST: 28 IU/L (ref 0–40)
Albumin: 4.2 g/dL (ref 3.9–4.9)
Alkaline Phosphatase: 77 IU/L (ref 44–121)
BUN/Creatinine Ratio: 12 (ref 12–28)
BUN: 8 mg/dL (ref 8–27)
Bilirubin Total: 0.4 mg/dL (ref 0.0–1.2)
CO2: 29 mmol/L (ref 20–29)
Calcium: 9.2 mg/dL (ref 8.7–10.3)
Chloride: 95 mmol/L — ABNORMAL LOW (ref 96–106)
Creatinine, Ser: 0.69 mg/dL (ref 0.57–1.00)
Globulin, Total: 2.7 g/dL (ref 1.5–4.5)
Glucose: 91 mg/dL (ref 70–99)
Potassium: 4.2 mmol/L (ref 3.5–5.2)
Sodium: 138 mmol/L (ref 134–144)
Total Protein: 6.9 g/dL (ref 6.0–8.5)
eGFR: 94 mL/min/1.73 (ref >59)

## 2024-06-20 LAB — CK TOTAL AND CKMB (NOT AT ARMC)
CK-MB Index: <1 ng/mL (ref 0.0–5.3)
Total CK: 38 U/L (ref 32–182)

## 2024-06-20 NOTE — Progress Notes (Signed)
 Hi Kathy, metabolic panel overall looks good.  Chlorides off by 1 point but not in a worrisome range.  The ALT liver enzyme is up just slightly similar to about a year ago.  Continue to work on healthy diet and regular exercise that usually helps reduce any inflammation in the liver.  Muscle enzyme level was normal which is great so no sign of excess muscle breakdown.

## 2024-07-15 ENCOUNTER — Other Ambulatory Visit: Payer: Self-pay | Admitting: Family Medicine

## 2024-07-15 DIAGNOSIS — F411 Generalized anxiety disorder: Secondary | ICD-10-CM

## 2024-07-16 NOTE — Telephone Encounter (Signed)
 Last RF 02/04/24 #60 30 day supply Last OV 06/19/24 No follow up scheduled.    Will also send to front office for her to schedule a f/u for IFG she was supposed to schedule for 11/28

## 2024-08-01 ENCOUNTER — Other Ambulatory Visit: Payer: Self-pay | Admitting: Family Medicine

## 2024-08-01 DIAGNOSIS — F411 Generalized anxiety disorder: Secondary | ICD-10-CM

## 2024-08-01 DIAGNOSIS — I10 Essential (primary) hypertension: Secondary | ICD-10-CM

## 2024-09-08 ENCOUNTER — Ambulatory Visit: Admitting: Family Medicine

## 2024-09-08 ENCOUNTER — Encounter: Payer: Self-pay | Admitting: Family Medicine

## 2024-09-08 VITALS — BP 124/66 | HR 66 | Ht 65.0 in | Wt 160.0 lb

## 2024-09-08 DIAGNOSIS — I10 Essential (primary) hypertension: Secondary | ICD-10-CM | POA: Diagnosis not present

## 2024-09-08 DIAGNOSIS — R7301 Impaired fasting glucose: Secondary | ICD-10-CM | POA: Diagnosis not present

## 2024-09-08 DIAGNOSIS — F17200 Nicotine dependence, unspecified, uncomplicated: Secondary | ICD-10-CM | POA: Diagnosis not present

## 2024-09-08 DIAGNOSIS — M170 Bilateral primary osteoarthritis of knee: Secondary | ICD-10-CM

## 2024-09-08 DIAGNOSIS — E7800 Pure hypercholesterolemia, unspecified: Secondary | ICD-10-CM | POA: Diagnosis not present

## 2024-09-08 DIAGNOSIS — M7061 Trochanteric bursitis, right hip: Secondary | ICD-10-CM

## 2024-09-08 LAB — POCT GLYCOSYLATED HEMOGLOBIN (HGB A1C): Hemoglobin A1C: 5.7 % — AB (ref 4.0–5.6)

## 2024-09-08 MED ORDER — ATORVASTATIN CALCIUM 10 MG PO TABS: 10.0000 mg | ORAL_TABLET | Freq: Every day | ORAL | 3 refills | Status: AC

## 2024-09-08 NOTE — Assessment & Plan Note (Signed)
 Impaired fasting glucose (prediabetes) A1c at 5.7 indicates prediabetes. Goal is to maintain A1c to prevent diabetes progression. - Monitor A1c periodically.

## 2024-09-08 NOTE — Progress Notes (Signed)
 Established Patient Office Visit  Patient ID: Valerie Carroll, female    DOB: July 23, 1956  Age: 68 y.o. MRN: [id number] PCP: Alvan Dorothyann BIRCH, MD  Chief Complaint  Patient presents with   ifg   Hypertension    Subjective:     HPI  Discussed the use of AI scribe software for clinical note transcription with the patient, who gave verbal consent to proceed.  History of Present Illness Valerie Carroll is a 68 year old female who presents for routine follow-up.  Glycemic status and gastrointestinal symptoms - Hemoglobin A1c is 5.7%, at the threshold for prediabetes. - Experiences gastrointestinal symptoms with dairy intake, characterized by immediate bowel movements without cramping. - Expresses concern about potential calorie malabsorption.  Hypertension management - Blood pressure is well-controlled on losartan  and hydrochlorothiazide  12.5 mg. - No adverse effects from antihypertensive medications.  Statin intolerance and current lipid management - Previously experienced bloating and fullness with simvastatin , prompting a switch to atorvastatin . - Currently tolerates atorvastatin  well and reports improved well-being.  Musculoskeletal pain - Bilateral knee pain attributed to arthritis, described as a sensation of 'pebbles' in the knees. - Knee pain worsens with weather changes. - Right hip pain attributed to bursitis, with improvement in symptoms with walking.  Lung cancer screening and smoking history - History of smoking. - Undergoes regular lung cancer screening due to smoking history. - Next lung cancer screening scheduled for October 06, 2024.  Immunization status - Received Zostavax vaccine in 2013. - Considering the newer shingles vaccine but expresses hesitancy.  Cardiopulmonary symptoms - No chest pain, palpitations, or peripheral edema.       09/08/2024    9:49 AM 06/19/2024   11:34 AM 03/05/2024    9:20 AM  Depression screen PHQ 2/9  Decreased  Interest 1 1 1   Down, Depressed, Hopeless 1 0 1  PHQ - 2 Score 2 1 2   Altered sleeping 1 1 2   Tired, decreased energy 1 3 1   Change in appetite 0 0 1  Feeling bad or failure about yourself  0 0 0  Trouble concentrating 0 1 0  Moving slowly or fidgety/restless 1 0 0  Suicidal thoughts 0 0 0  PHQ-9 Score 5 6  6    Difficult doing work/chores Not difficult at all Somewhat difficult Somewhat difficult     Data saved with a previous flowsheet row definition      ROS    Objective:     BP 124/66   Pulse 66   Ht 5' 5 (1.651 m)   Wt 160 lb (72.6 kg)   SpO2 99%   BMI 26.63 kg/m    Physical Exam Vitals and nursing note reviewed.  Constitutional:      Appearance: Normal appearance.  HENT:     Head: Normocephalic and atraumatic.  Eyes:     Conjunctiva/sclera: Conjunctivae normal.  Cardiovascular:     Rate and Rhythm: Normal rate and regular rhythm.  Pulmonary:     Effort: Pulmonary effort is normal.     Breath sounds: Normal breath sounds.  Skin:    General: Skin is warm and dry.  Neurological:     Mental Status: She is alert.  Psychiatric:        Mood and Affect: Mood normal.      Results for orders placed or performed in visit on 09/08/24  POCT HgB A1C  Result Value Ref Range   Hemoglobin A1C 5.7 (A) 4.0 - 5.6 %   HbA1c POC (<> result,  manual entry)     HbA1c, POC (prediabetic range)     HbA1c, POC (controlled diabetic range)        The 10-year ASCVD risk score (Arnett DK, et al., 2019) is: 17.7%    Assessment & Plan:   Problem List Items Addressed This Visit       Cardiovascular and Mediastinum   Essential hypertension, benign - Primary   Essential hypertension Blood pressure well-controlled with losartan  and hydrochlorothiazide . - Continue losartan  and hydrochlorothiazide .       Relevant Medications   atorvastatin  (LIPITOR) 10 MG tablet   Other Relevant Orders   CMP14+EGFR   Lipid panel     Endocrine   IFG (impaired fasting glucose)    Impaired fasting glucose (prediabetes) A1c at 5.7 indicates prediabetes. Goal is to maintain A1c to prevent diabetes progression. - Monitor A1c periodically.      Relevant Orders   POCT HgB A1C (Completed)   CMP14+EGFR   Lipid panel     Musculoskeletal and Integument   Primary osteoarthritis of both knees   Bilateral knee osteoarthritis Knee pain manageable, suggestive of osteoarthritis. - Monitor symptoms, consider sports medicine referral if symptoms worsen.        Other   TOBACCO DEPENDENCE   Schedule for lung Ca screening.       Hyperlipidemia   Pure hypercholesterolemia Switched to atorvastatin  due to simvastatin  adverse effects. Atorvastatin  well-tolerated. Lipid levels and liver function need rechecking. - Order lipid panel and liver function tests.       Relevant Medications   atorvastatin  (LIPITOR) 10 MG tablet   Other Relevant Orders   CMP14+EGFR   Lipid panel   Other Visit Diagnoses       Trochanteric bursitis of right hip           Assessment and Plan Assessment & Plan  Right hip bursitis Right hip pain likely due to bursitis, improves with walking. - Encourage continued walking.  General Health Maintenance Discussed shingles vaccination. Considering new vaccine with higher efficacy, covered by Medicare Part B. - Consider new shingles vaccine at pharmacy.  Return in about 6 months (around 03/09/2025) for Hypertension, Pre-diabetes.    [name], MD [facility] at [facility]

## 2024-09-08 NOTE — Assessment & Plan Note (Signed)
 Schedule for lung Ca screening.

## 2024-09-08 NOTE — Assessment & Plan Note (Signed)
 Bilateral knee osteoarthritis Knee pain manageable, suggestive of osteoarthritis. - Monitor symptoms, consider sports medicine referral if symptoms worsen.

## 2024-09-08 NOTE — Assessment & Plan Note (Signed)
 Pure hypercholesterolemia Switched to atorvastatin  due to simvastatin  adverse effects. Atorvastatin  well-tolerated. Lipid levels and liver function need rechecking. - Order lipid panel and liver function tests.

## 2024-09-08 NOTE — Assessment & Plan Note (Signed)
 Essential hypertension Blood pressure well-controlled with losartan  and hydrochlorothiazide . - Continue losartan  and hydrochlorothiazide .

## 2024-09-09 ENCOUNTER — Encounter: Payer: Self-pay | Admitting: Family Medicine

## 2024-09-09 ENCOUNTER — Ambulatory Visit: Payer: Self-pay | Admitting: Family Medicine

## 2024-09-09 LAB — LIPID PANEL
Chol/HDL Ratio: 4.2 ratio (ref 0.0–4.4)
Cholesterol, Total: 189 mg/dL (ref 100–199)
HDL: 45 mg/dL (ref >39)
LDL Chol Calc (NIH): 112 mg/dL — ABNORMAL HIGH (ref 0–99)
Triglycerides: 180 mg/dL — ABNORMAL HIGH (ref 0–149)
VLDL Cholesterol Cal: 32 mg/dL (ref 5–40)

## 2024-09-09 LAB — CMP14+EGFR
ALT: 26 IU/L (ref 0–32)
AST: 23 IU/L (ref 0–40)
Albumin: 4.4 g/dL (ref 3.9–4.9)
Alkaline Phosphatase: 69 IU/L (ref 49–135)
BUN/Creatinine Ratio: 13 (ref 12–28)
BUN: 8 mg/dL (ref 8–27)
Bilirubin Total: 0.4 mg/dL (ref 0.0–1.2)
CO2: 26 mmol/L (ref 20–29)
Calcium: 9.3 mg/dL (ref 8.7–10.3)
Chloride: 99 mmol/L (ref 96–106)
Creatinine, Ser: 0.64 mg/dL (ref 0.57–1.00)
Globulin, Total: 2.6 g/dL (ref 1.5–4.5)
Glucose: 89 mg/dL (ref 70–99)
Potassium: 4 mmol/L (ref 3.5–5.2)
Sodium: 140 mmol/L (ref 134–144)
Total Protein: 7 g/dL (ref 6.0–8.5)
eGFR: 96 mL/min/1.73 (ref >59)

## 2024-09-09 NOTE — Progress Notes (Signed)
 Hi Valerie Carroll, LDL still mildly elevated as well as triglycerides.  Total cholesterol though looks good.  Continue to work on healthy Mediterranean diet the ALT liver enzyme is back to normal which is great.  Kidney function is stable.

## 2024-10-01 ENCOUNTER — Other Ambulatory Visit: Payer: Self-pay

## 2024-10-01 ENCOUNTER — Ambulatory Visit
Admission: RE | Admit: 2024-10-01 | Discharge: 2024-10-01 | Disposition: A | Discharge status: Home / Self Care | Attending: Family Medicine | Admitting: Family Medicine

## 2024-10-01 VITALS — BP 105/72 | HR 81 | Temp 97.8°F | Resp 20 | Ht 65.0 in | Wt 160.0 lb

## 2024-10-01 DIAGNOSIS — R059 Cough, unspecified: Secondary | ICD-10-CM | POA: Diagnosis not present

## 2024-10-01 DIAGNOSIS — J309 Allergic rhinitis, unspecified: Secondary | ICD-10-CM | POA: Diagnosis not present

## 2024-10-01 LAB — POC SOFIA SARS ANTIGEN FIA: SARS Coronavirus 2 Ag: NEGATIVE

## 2024-10-01 LAB — POCT INFLUENZA A/B
Influenza A, POC: NEGATIVE
Influenza B, POC: NEGATIVE

## 2024-10-01 MED ORDER — FEXOFENADINE HCL 180 MG PO TABS: 180.0000 mg | ORAL_TABLET | Freq: Every day | ORAL | 0 refills | Status: DC

## 2024-10-01 MED ORDER — PROMETHAZINE-DM 6.25-15 MG/5ML PO SYRP: 5.0000 mL | ORAL_SOLUTION | Freq: Two times a day (BID) | ORAL | 0 refills | Status: DC | PRN

## 2024-10-01 NOTE — Discharge Instructions (Addendum)
 Advised patient take medication (Allegra ) as directed with food for the next 5 days.  Advised may use Allegra  as needed afterwards for concurrent postnasal drainage/drip/runny nose/sinus congestion.  Advised may use Promethazine  DM at night for cough prior to sleep due to sedative effects.  Advised if symptoms worsen and/or unresolved please follow-up with your PCP or here for further evaluation.

## 2024-10-01 NOTE — ED Triage Notes (Signed)
 Pt presenting with c/o headache, facial pressure, post nasal drip and non productive cough x 1 day. Pt stated that she took Advil  last night with minimal effectiveness.

## 2024-10-01 NOTE — ED Provider Notes (Signed)
 " Valerie Carroll    CSN: [id number] Arrival date & time: 10/01/24  1103      History   Chief Complaint Chief Complaint  Patient presents with   Cough    Possible sinus infection - Entered by patient    HPI Valerie Carroll is a 68 y.o. female.   HPI Very pleasant 68 year old female presents with headache, facial pressure and postnasal drip with productive cough for 1 day.  Reports taking Advil  with minimal effectiveness.  PMH significant for tobacco dependence, HTN, and SCC.  Patient reports returning from visiting grand daughter who may have gotten her sick.  Past Medical History:  Diagnosis Date   Arthritis    right shoulder   GAD (generalized anxiety disorder)    History of basal cell carcinoma (BCC) excision    History of melanoma excision 11/2020   s/p  MOH's surgery on nose , localized   Hyperlipidemia    Hypertension    followed by pcp   (hx evaluated by cardiology for pre-corial pain and abnormal ekg by dr pietro,  normal CCTA 01-21-2021 no disease)   PMB (postmenopausal bleeding)    Pre-diabetes    RLS (restless legs syndrome)    Wears contact lenses     Patient Active Problem List   Diagnosis Date Noted   Nodule of apex of right lung 10/29/2023   Aortic atherosclerosis 10/29/2023   Primary osteoarthritis of both knees 01/19/2023   Endocervical polyp    Endometrial polyp    Postmenopausal bleeding 12/25/2021   Osteopenia after menopause 08/04/2021   Primary osteoarthritis of both shoulders 03/01/2021   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 11/10/2020   Lumbago 12/26/2016   Myofascial pain syndrome 07/26/2012   Cervical radiculopathy at C8 06/26/2012   Hyperlipidemia 03/30/2010   INSOMNIA 06/04/2009   IFG (impaired fasting glucose) 07/16/2008   FATIGUE 07/14/2008   Asymptomatic postmenopausal status 07/14/2008   Essential hypertension, benign 08/22/2007   Generalized anxiety disorder 07/17/2006   TOBACCO DEPENDENCE 07/17/2006    Past  Surgical History:  Procedure Laterality Date   HYSTEROSCOPY WITH D & C N/A 01/04/2022   Procedure: DILATATION AND CURETTAGE /HYSTEROSCOPY/POLYPECTOMY;  Surgeon: Cleatus Moccasin, MD;  Location: [facility] [place];  Service: Gynecology;  Laterality: N/A;   KNEE ARTHROSCOPY Left    1980, 1981   MOHS SURGERY  01/2021   TONSILLECTOMY AND ADENOIDECTOMY  1964   age 68    OB History     Gravida  2   Para  2   Term  2   Preterm      AB      Living         SAB      IAB      Ectopic      Multiple      Live Births  2            Home Medications    Prior to Admission medications  Medication Sig Start Date End Date Taking? Authorizing Provider  fexofenadine  (ALLEGRA  ALLERGY) 180 MG tablet Take 1 tablet (180 mg total) by mouth daily. 10/01/24 10/16/24 Yes Teddy Sharper, FNP  promethazine -dextromethorphan (PROMETHAZINE -DM) 6.25-15 MG/5ML syrup Take 5 mLs by mouth 2 (two) times daily as needed for cough. 10/01/24  Yes Teddy Sharper, FNP  ALPRAZolam  (XANAX ) 0.5 MG tablet TAKE 1/2-1 TABLET BY MOUTH 2 TIMES A DAY AS NEEDED FOR ANXIETY 07/16/24   Alvan Dorothyann BIRCH, MD  atorvastatin  (LIPITOR) 10 MG tablet Take 1  tablet (10 mg total) by mouth at bedtime. 09/08/24   Alvan Dorothyann BIRCH, MD  escitalopram  (LEXAPRO ) 20 MG tablet TAKE 1 TABLET BY MOUTH DAILY 08/01/24   Alvan Dorothyann BIRCH, MD  losartan -hydrochlorothiazide  (HYZAAR) 50-12.5 MG tablet TAKE 1 TABLET BY MOUTH DAILY 08/01/24   Alvan Dorothyann BIRCH, MD    Family History Family History  Problem Relation Age of Onset   Heart attack Mother 63   Stroke Mother    Breast cancer Mother 71   COPD Mother        smoker    Social History Social History[1]   Allergies   Citalopram hydrobromide, Effexor  [venlafaxine ], Lisinopril , and Sertraline    Review of Systems Review of Systems  HENT:  Positive for postnasal drip.   Respiratory:  Positive for cough.   All other systems reviewed and are  negative.    Physical Exam Triage Vital Signs ED Triage Vitals  Encounter Vitals Group     BP      Girls Systolic BP Percentile      Girls Diastolic BP Percentile      Boys Systolic BP Percentile      Boys Diastolic BP Percentile      Pulse      Resp      Temp      Temp src      SpO2      Weight      Height      Head Circumference      Peak Flow      Pain Score      Pain Loc      Pain Education      Exclude from Growth Chart    No data found.  Updated Vital Signs BP 105/72 (BP Location: Right Arm)   Pulse 81   Temp 97.8 F (36.6 C) (Oral)   Resp 20   Ht 5' 5 (1.651 m)   Wt 160 lb (72.6 kg)   SpO2 98%   BMI 26.63 kg/m    Physical Exam Vitals and nursing note reviewed.  Constitutional:      Appearance: Normal appearance. She is obese. She is ill-appearing.  HENT:     Head: Normocephalic and atraumatic.     Right Ear: Tympanic membrane and external ear normal.     Left Ear: Tympanic membrane and external ear normal.     Mouth/Throat:     Mouth: Mucous membranes are moist.     Pharynx: Oropharynx is clear.     Comments: Significant amount of clear drainage of posterior oropharynx noted. Eyes:     Extraocular Movements: Extraocular movements intact.     Conjunctiva/sclera: Conjunctivae normal.     Pupils: Pupils are equal, round, and reactive to light.  Cardiovascular:     Rate and Rhythm: Normal rate and regular rhythm.     Heart sounds: Normal heart sounds.  Pulmonary:     Effort: Pulmonary effort is normal.     Breath sounds: Normal breath sounds. No wheezing, rhonchi or rales.     Comments: Infrequent nonproductive cough on exam Musculoskeletal:        General: Normal range of motion.  Skin:    General: Skin is warm.  Neurological:     General: No focal deficit present.     Mental Status: She is alert and oriented to person, place, and time.  Psychiatric:        Mood and Affect: Mood normal.        Behavior: Behavior  normal.      UC  Treatments / Results  Labs (all labs ordered are listed, but only abnormal results are displayed) Labs Reviewed  POC SOFIA SARS ANTIGEN FIA - Normal  POCT INFLUENZA A/B - Normal    EKG   Radiology No results found.  Procedures Procedures (including critical Carroll time)  Medications Ordered in UC Medications - No data to display  Initial Impression / Assessment and Plan / UC Course  I have reviewed the triage vital signs and the nursing notes.  Pertinent labs & imaging results that were available during my Carroll of the patient were reviewed by me and considered in my medical decision making (see chart for details).     MDM: 1.,  Cough, unspecified type-Rx'd Promethazine  DM 6.25-15 mg / 5 mL syrup: Take 5 mL twice daily, as needed for cough; 2.  Allergic rhinitis, unspecified seasonality, unspecified trigger-Rx'd Allegra  180 mg tablet, take 1 tablet daily x 5 days. Advised patient take medication (Allegra ) as directed with food for the next 5 days.  Advised may use Allegra  as needed afterwards for concurrent postnasal drainage/drip/runny nose/sinus congestion.  Advised may use Promethazine  DM at night for cough prior to sleep due to sedative effects.  Advised if symptoms worsen and/or unresolved please follow-up with your PCP or here for further evaluation.  Patient discharged home, hemodynamically stable. Final Clinical Impressions(s) / UC Diagnoses   Final diagnoses:  Cough, unspecified type  Allergic rhinitis, unspecified seasonality, unspecified trigger     Discharge Instructions      Advised patient take medication (Allegra ) as directed with food for the next 5 days.  Advised may use Allegra  as needed afterwards for concurrent postnasal drainage/drip/runny nose/sinus congestion.  Advised may use Promethazine  DM at night for cough prior to sleep due to sedative effects.  Advised if symptoms worsen and/or unresolved please follow-up with your PCP or here for further  evaluation.     ED Prescriptions     Medication Sig Dispense Auth. Provider   fexofenadine  (ALLEGRA  ALLERGY) 180 MG tablet Take 1 tablet (180 mg total) by mouth daily. 15 tablet Aleksis Jiggetts, FNP   promethazine -dextromethorphan (PROMETHAZINE -DM) 6.25-15 MG/5ML syrup Take 5 mLs by mouth 2 (two) times daily as needed for cough. 118 mL Burke Terry, FNP      PDMP not reviewed this encounter.    [1]  Social History Tobacco Use   Smoking status: Every Day    Current packs/day: 1.00    Average packs/day: 1 pack/day for 52.0 years (52.0 ttl pk-yrs)    Types: Cigarettes   Smokeless tobacco: Never   Tobacco comments:    1 pack a day   Vaping Use   Vaping status: Never Used  Substance Use Topics   Alcohol use: Not Currently    Comment: occasionally   Drug use: No     Teddy Sharper, FNP 10/01/24 1156  "

## 2024-10-06 ENCOUNTER — Ambulatory Visit

## 2024-10-06 DIAGNOSIS — F17210 Nicotine dependence, cigarettes, uncomplicated: Secondary | ICD-10-CM | POA: Diagnosis not present

## 2024-10-06 DIAGNOSIS — R911 Solitary pulmonary nodule: Secondary | ICD-10-CM | POA: Diagnosis not present

## 2024-10-06 DIAGNOSIS — Z122 Encounter for screening for malignant neoplasm of respiratory organs: Secondary | ICD-10-CM

## 2024-10-06 DIAGNOSIS — Z87891 Personal history of nicotine dependence: Secondary | ICD-10-CM

## 2024-10-06 DIAGNOSIS — R918 Other nonspecific abnormal finding of lung field: Secondary | ICD-10-CM

## 2024-10-07 ENCOUNTER — Ambulatory Visit: Admitting: Family Medicine

## 2024-10-07 ENCOUNTER — Encounter: Payer: Self-pay | Admitting: Family Medicine

## 2024-10-07 VITALS — BP 134/57 | HR 71 | Temp 97.4°F | Ht 65.0 in | Wt 160.0 lb

## 2024-10-07 DIAGNOSIS — J0100 Acute maxillary sinusitis, unspecified: Secondary | ICD-10-CM | POA: Diagnosis not present

## 2024-10-07 MED ORDER — HYDROCOD POLI-CHLORPHE POLI ER 10-8 MG/5ML PO SUER: 5.0000 mL | Freq: Every evening | ORAL | 0 refills | Status: DC | PRN

## 2024-10-07 MED ORDER — AMOXICILLIN-POT CLAVULANATE 875-125 MG PO TABS: 1.0000 | ORAL_TABLET | Freq: Two times a day (BID) | ORAL | 0 refills | Status: DC

## 2024-10-07 NOTE — Progress Notes (Signed)
 Pt reports that her sxs began 8 days ago she was seen at [facility] on 12/24 she was given Allegra  allergy and Promethazine -DM.   She is still experiencing coughing fits, postnasal drip, headache teeth pain. Denies any fevers. She has had some chills on occasion.

## 2024-10-07 NOTE — Progress Notes (Signed)
 "  Acute Office Visit  Patient ID: Valerie Carroll, female    DOB: 22-Jul-1956, 68 y.o.   MRN: [id number]  PCP: Alvan Dorothyann BIRCH, MD  Chief Complaint  Patient presents with   Cough   Sinusitis    Subjective:     HPI  Discussed the use of AI scribe software for clinical note transcription with the patient, who gave verbal consent to proceed.  History of Present Illness Valerie Carroll is a 68 year old female who presents with sinus congestion and facial pain.  Sinus congestion and facial pain - Onset last Monday with significant worsening by Tuesday - Severe sinus congestion, facial pain, and headache - Dental pain and sensation of pressure in the head - Difficulty clearing mucus from the throat - Initially unable to cough effectively  Associated symptoms and negative findings - No chest pain, sore throat, or ear pain  Recent illness exposure - Symptoms may have started after visiting daughter's house where children were sick  Prior sinus infections - History of recurrent sinus infections - Received antibiotics for one month last year  Prior and current treatments - Used Flonase in the past, unsure about use during current episode - Currently using Allegra  - Completed a course of cough medicine without symptom relief  Infectious disease testing - Tested for influenza and COVID-19, both negative   ROS     Objective:    BP (!) 134/57   Pulse 71   Temp (!) 97.4 F (36.3 C) (Oral)   Ht 5' 5 (1.651 m)   Wt 160 lb (72.6 kg)   SpO2 95%   BMI 26.63 kg/m    Physical Exam Constitutional:      Appearance: Normal appearance.  HENT:     Head: Normocephalic and atraumatic.     Right Ear: Tympanic membrane, ear canal and external ear normal. There is no impacted cerumen.     Left Ear: Tympanic membrane, ear canal and external ear normal. There is no impacted cerumen.     Nose: Nose normal.     Mouth/Throat:     Pharynx: Oropharynx is clear. Posterior  oropharyngeal erythema present.  Eyes:     Conjunctiva/sclera: Conjunctivae normal.  Cardiovascular:     Rate and Rhythm: Normal rate and regular rhythm.  Pulmonary:     Effort: Pulmonary effort is normal.     Breath sounds: Normal breath sounds.  Musculoskeletal:     Cervical back: Neck supple. No tenderness.  Lymphadenopathy:     Cervical: No cervical adenopathy.  Skin:    General: Skin is warm and dry.  Neurological:     Mental Status: She is alert and oriented to person, place, and time.  Psychiatric:        Mood and Affect: Mood normal.       No results found for any visits on 10/07/24.     Assessment & Plan:   Problem List Items Addressed This Visit   None Visit Diagnoses       Acute non-recurrent maxillary sinusitis    -  Primary   Relevant Medications   amoxicillin -clavulanate (AUGMENTIN ) 875-125 MG tablet   chlorpheniramine-HYDROcodone  (TUSSIONEX) 10-8 MG/5ML       Assessment and Plan Assessment & Plan Acute sinusitis Inflammation and edema present. - Prescribed antibiotic for bacterial infection. - Advised restarting Flonase for inflammation reduction. - Prescribed codeine  cough syrup for cough. - Advised minimizing exposure to sick individuals.    Meds ordered this encounter  Medications  amoxicillin -clavulanate (AUGMENTIN ) 875-125 MG tablet    Sig: Take 1 tablet by mouth 2 (two) times daily.    Dispense:  14 tablet    Refill:  0   chlorpheniramine-HYDROcodone  (TUSSIONEX) 10-8 MG/5ML    Sig: Take 5 mLs by mouth at bedtime as needed for cough.    Dispense:  115 mL    Refill:  0    Return if symptoms worsen or fail to improve.  [name], MD [facility] at [facility]   "

## 2024-10-15 ENCOUNTER — Telehealth: Payer: Self-pay | Admitting: Acute Care

## 2024-10-15 NOTE — Telephone Encounter (Signed)
 Call report LDCT:  IMPRESSION: 1. Lung-RADS 4A, suspicious. Follow up low-dose chest CT without contrast in 3 months (please use the following order, CT CHEST LCS NODULE FOLLOW-UP W/O CM) is recommended. Alternatively, PET may be considered when there is a solid component 8mm or larger. Two part-solid apical right upper lobe pulmonary nodules measuring 1.6 cm with 0.3 cm solid component (lateral apical right upper lobe) and 1.1 cm with 0.6 cm solid component (medial apical right upper lobe), respectively, are not appreciably changed since 04/01/2024 screening chest CT, although both are clearly slowly increasing on multiple CT studies back to baseline 03/22/22 CT. 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 2. New patchy groundglass nodular opacities in the right middle lobe, favor inflammatory. 3. One vessel coronary atherosclerosis. 4. Cholelithiasis. 5. Aortic Atherosclerosis (ICD10-I70.0) and Emphysema (ICD10-J43.9).   These results will be called to the ordering clinician or representative by the Radiologist Assistant, and communication documented in the PACS or [employer].     Electronically Signed   By: Selinda DELENA Blue M.D.   On: 10/15/2024 11:23

## 2024-10-15 NOTE — Telephone Encounter (Signed)
 I have attempted to call the patient with the results of her low-dose screening CT.  There was no answer.  I have left a HIPAA compliant message on her voicemail requesting that she call the office for the results of her scan.  I have left her contact information so she knows how to get in touch with us .  Patient has a part solid right apical 1.6 cm nodule with a new 0.3 cm solid component that has been slowly increasing over several scans dating back to 03/28/2022.  Similarly there is a part solid medial right upper lobe 1.1 cm nodule with a newly formed 0.6 cm solid component again clearly growing since 03/28/2022.  Plan will be for a PET scan to further evaluate these findings. Please let me know if the patient calls back, so we can review these results with her and let her know what we feel is the best next step moving forward.  I will not place order for PET scan until we have spoken with the patient. Thank so much

## 2024-10-15 NOTE — Telephone Encounter (Signed)
 See provider note 10/15/2024 for plan

## 2024-10-16 ENCOUNTER — Other Ambulatory Visit: Payer: Self-pay

## 2024-10-16 DIAGNOSIS — Z122 Encounter for screening for malignant neoplasm of respiratory organs: Secondary | ICD-10-CM

## 2024-10-16 DIAGNOSIS — R918 Other nonspecific abnormal finding of lung field: Secondary | ICD-10-CM

## 2024-10-16 DIAGNOSIS — R911 Solitary pulmonary nodule: Secondary | ICD-10-CM

## 2024-10-16 DIAGNOSIS — Z87891 Personal history of nicotine dependence: Secondary | ICD-10-CM

## 2024-10-16 NOTE — Telephone Encounter (Signed)
 Patient returned call. Recent Lung CT results reviewed. She is in agreement to complete a PET to evaluate nodule stability. Order placed. Results and plan to PCP.

## 2024-10-21 ENCOUNTER — Telehealth: Payer: Self-pay

## 2024-10-21 NOTE — Telephone Encounter (Signed)
 LVM to call office and schedule OV with Srah to review PET results. PET scheduled for 10/27/2024.

## 2024-10-22 ENCOUNTER — Other Ambulatory Visit: Payer: Self-pay | Admitting: Family Medicine

## 2024-10-22 DIAGNOSIS — F411 Generalized anxiety disorder: Secondary | ICD-10-CM

## 2024-10-27 ENCOUNTER — Encounter (HOSPITAL_COMMUNITY)
Admission: RE | Admit: 2024-10-27 | Discharge: 2024-10-27 | Disposition: A | Discharge status: Home / Self Care | Source: Ambulatory Visit | Attending: Acute Care | Admitting: Acute Care

## 2024-10-27 DIAGNOSIS — R911 Solitary pulmonary nodule: Secondary | ICD-10-CM | POA: Insufficient documentation

## 2024-10-27 DIAGNOSIS — Z122 Encounter for screening for malignant neoplasm of respiratory organs: Secondary | ICD-10-CM | POA: Insufficient documentation

## 2024-10-27 DIAGNOSIS — Z87891 Personal history of nicotine dependence: Secondary | ICD-10-CM | POA: Insufficient documentation

## 2024-10-27 DIAGNOSIS — R918 Other nonspecific abnormal finding of lung field: Secondary | ICD-10-CM | POA: Diagnosis present

## 2024-10-27 LAB — GLUCOSE, CAPILLARY: Glucose-Capillary: 123 mg/dL — ABNORMAL HIGH (ref 70–99)

## 2024-10-27 MED ORDER — FLUDEOXYGLUCOSE F - 18 (FDG) INJECTION
7.9600 | Freq: Once | INTRAVENOUS | Status: AC | PRN
  Administered 2024-10-27: 7.96 via INTRAVENOUS

## 2024-10-29 NOTE — Telephone Encounter (Addendum)
 She is scheduled to see you 11/03/2024

## 2024-11-03 ENCOUNTER — Ambulatory Visit: Admitting: Acute Care

## 2024-11-11 ENCOUNTER — Ambulatory Visit: Admitting: Acute Care

## 2024-11-11 ENCOUNTER — Encounter: Payer: Self-pay | Admitting: Acute Care

## 2024-11-11 ENCOUNTER — Other Ambulatory Visit: Payer: Self-pay | Admitting: Acute Care

## 2024-11-11 VITALS — BP 122/71 | HR 87 | Temp 97.6°F | Ht 65.0 in | Wt 159.8 lb

## 2024-11-11 DIAGNOSIS — F17210 Nicotine dependence, cigarettes, uncomplicated: Secondary | ICD-10-CM | POA: Diagnosis not present

## 2024-11-11 DIAGNOSIS — Z720 Tobacco use: Secondary | ICD-10-CM

## 2024-11-11 DIAGNOSIS — I2510 Atherosclerotic heart disease of native coronary artery without angina pectoris: Secondary | ICD-10-CM | POA: Diagnosis not present

## 2024-11-11 DIAGNOSIS — R9389 Abnormal findings on diagnostic imaging of other specified body structures: Secondary | ICD-10-CM

## 2024-11-11 DIAGNOSIS — R918 Other nonspecific abnormal finding of lung field: Secondary | ICD-10-CM | POA: Diagnosis not present

## 2024-11-11 DIAGNOSIS — R911 Solitary pulmonary nodule: Secondary | ICD-10-CM

## 2024-11-11 DIAGNOSIS — R942 Abnormal results of pulmonary function studies: Secondary | ICD-10-CM

## 2024-11-11 NOTE — Progress Notes (Signed)
 "  History of Present Illness Valerie Carroll is a 69 y.o. female current every day smoker followed through the lung cancer screening program. She is here today for consult after an abnormal LDCT. She will be followed by the IP team.    11/11/2024 Discussed the use of AI scribe software for clinical note transcription with the patient, who gave verbal consent to proceed.  History of Present Illness Valerie Carroll is a 69 year old female who presents for follow-up after an abnormal Low Dose CT Chest She is part of a lung cancer screening program and has been under surveillance for lung nodules. In June, a scan showed a small growth in a nodule, prompting a follow-up scan in six months. The recent scan revealed further growth in the right upper lobe nodule, which now has a solid component. Despite the growth, a PET scan showed no hypermetabolic activity. The nodules have shown a gradual increase in size over time. One nodule increased from 12.4 mm to 14.6 mm, developing a solid component. Another nodule grew from 6 mm to 13.6 mm, with a 7.5 mm solid component.  While the nodules are not PET avid, they are concerning for a low grade adenocarcinoma.We discussed doing a repeat CT in 3 months of biopsy now. She would prefer 3 month follow up scan. This will be done through  the lung cancer screening program. She will follow up with me 1 week after the scan to review results and determine next best steps in plan of care.   The CT Chest also showed Left Anterior Descending atherosclerosis. There was also notation of atherosclerotic nonaneurysmal thoracic aorta.   She has a significant family history of heart disease, with her mother and three maternal uncles having early onset heart disease. Her mother began experiencing heart issues at 66 and lived to 81, while her uncles died by 60. She does not currently see a cardiologist but has seen one in the past. She is on Lipitor for cholesterol management.She  would like referral to cardiology to establish.  She smokes and acknowledges the impact on her health, noting that her mother and uncles also smoked. She has attempted smoking cessation multiple times using patches and gum, but has not yet tried hypnosis. She experiences occasional back pain when lying down but denies chest pain or angina.  No weight loss or hemoptysis.     Test Results: PET scan 10/27/2024 No hypermetabolic activity associated with either slowly enlarging right upper lobe pulmonary nodule. These remain suspicious for low-grade adenocarcinoma. 2. No evidence of metastatic disease. 3. Numerous additional ground-glass nodules in both lungs are grossly stable, without metabolic activity. Continued attention on follow-up recommended. 4. Cholelithiasis. 5. Aortic Atherosclerosis (ICD10-I70.0) and Emphysema (ICD10-J43.9).  LDCT 10/06/2024 Lungs/Pleura: No pneumothorax. No pleural effusion. Mild centrilobular emphysema with diffuse bronchial wall thickening. No acute consolidative airspace disease or lung masses. Part solid apical right upper lobe 1.6 cm nodule with a 0.3 cm solid component on series 3/image 61, not appreciably changed since 04/01/24 CT, although clearly increasing slowly on multiple CT studies back to baseline 03/22/22 CT where it was pure groundglass density and measured 1.3 cm. Similarly, a part solid medial apical right upper lobe 1.1 cm nodule with a 0.6 cm solid component on image 37, not appreciably changed since 04/01/24 CT, although clearly slowly increasing on multiple CT studies back to baseline 03/22/22 CT where it measured 0.8 cm with 0.4 cm solid component. Numerous additional ground-glass nodules up to 17.0 mm  in the posterior right upper lobe on image 74 are all stable. New patchy groundglass nodular opacities in the right middle lobe up to 7 mm.  Lung-RADS 4A, suspicious. Follow up low-dose chest CT without contrast in 3 months (please use  the following order, CT CHEST LCS NODULE FOLLOW-UP W/O CM) is recommended. Alternatively, PET may be considered when there is a solid component 8mm or larger. Two part-solid apical right upper lobe pulmonary nodules measuring 1.6 cm with 0.3 cm solid component (lateral apical right upper lobe) and 1.1 cm with 0.6 cm solid component (medial apical right upper lobe), respectively, are not appreciably changed since 04/01/2024 screening chest CT, although both are clearly slowly increasing on multiple CT studies back to baseline 03/22/22 CT. 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 2. New patchy groundglass nodular opacities in the right middle lobe, favor inflammatory.  LDCT 04/01/2024 IMPRESSION: 1. Lung-RADS 2, benign appearance or behavior. Continue annual screening with low-dose chest CT without contrast in 12 months. 2. Coronary artery calcifications. 3. Gallstones. 4.  Aortic Atherosclerosis (ICD10-I70.0).    Latest Ref Rng & Units 03/05/2024    9:38 AM 01/04/2022    8:52 AM 11/09/2020   12:00 AM  CBC  WBC 3.4 - 10.8 x10E3/uL 6.2   6.9   Hemoglobin 11.1 - 15.9 g/dL 85.4  85.6  84.9   Hematocrit 34.0 - 46.6 % 43.6  42.0  43.3   Platelets 150 - 450 x10E3/uL 266   271        Latest Ref Rng & Units 09/08/2024   10:23 AM 06/19/2024   12:08 PM 03/05/2024    9:38 AM  BMP  Glucose 70 - 99 mg/dL 89  91  887   BUN 8 - 27 mg/dL 8  8  10    Creatinine 0.57 - 1.00 mg/dL 9.35  9.30  9.23   BUN/Creat Ratio 12 - 28 13  12  13    Sodium 134 - 144 mmol/L 140  138  140   Potassium 3.5 - 5.2 mmol/L 4.0  4.2  3.8   Chloride 96 - 106 mmol/L 99  95  100   CO2 20 - 29 mmol/L 26  29  23    Calcium  8.7 - 10.3 mg/dL 9.3  9.2  9.4     BNP No results found for: BNP  ProBNP No results found for: PROBNP  PFT No results found for: FEV1PRE, FEV1POST, FVCPRE, FVCPOST, TLC, DLCOUNC, PREFEV1FVCRT, PSTFEV1FVCRT  NM PET  Image Initial (PI) Skull Base To Thigh Result Date: 10/30/2024 CLINICAL DATA:  Initial treatment strategy for enlarging part solid right upper lobe pulmonary nodules. EXAM: NUCLEAR MEDICINE PET SKULL BASE TO THIGH TECHNIQUE: 7.96 mCi F-18 FDG was injected intravenously. Full-ring PET imaging was performed from the skull base to thigh after the radiotracer. CT data was obtained and used for attenuation correction and anatomic localization. Fasting blood glucose: 123 mg/dl COMPARISON:  Multiple prior chest CTs, most recently 10/06/2024 and 04/01/2024. FINDINGS: Mediastinal blood pool activity: SUV max 3.4 NECK: No hypermetabolic cervical lymph nodes are identified.Fairly symmetric activity within the lymphoid tissue of Waldeyer's ring is within physiologic limits. No suspicious activity identified within the pharyngeal mucosal space. Incidental CT findings: none CHEST: There are no hypermetabolic mediastinal, hilar or axillary lymph nodes. No hypermetabolic pulmonary activity. Specifically, there is no increased metabolic activity associated with either slowly enlarging right upper lobe nodule. Numerous additional ground-glass nodules are present in both lungs, grossly stable, without metabolic  activity. Incidental CT findings: Atherosclerosis of the aorta, great vessels and coronary arteries. Mild centrilobular emphysema. ABDOMEN/PELVIS: There is no hypermetabolic activity within the liver, adrenal glands, spleen or pancreas. There is no hypermetabolic nodal activity in the abdomen or pelvis. Incidental CT findings: Calcified gallstones noted. Aortoiliac atherosclerosis. Mild distal colonic diverticulosis without evidence of acute inflammation. SKELETON: There is no hypermetabolic activity to suggest osseous metastatic disease. Incidental CT findings: Mild spondylosis. IMPRESSION: 1. No hypermetabolic activity associated with either slowly enlarging right upper lobe pulmonary nodule. These remain suspicious for  low-grade adenocarcinoma. 2. No evidence of metastatic disease. 3. Numerous additional ground-glass nodules in both lungs are grossly stable, without metabolic activity. Continued attention on follow-up recommended. 4. Cholelithiasis. 5. Aortic Atherosclerosis (ICD10-I70.0) and Emphysema (ICD10-J43.9). Electronically Signed   By: Elsie Perone M.D.   On: 10/30/2024 13:40     Past medical hx Past Medical History:  Diagnosis Date   Arthritis    right shoulder   GAD (generalized anxiety disorder)    History of basal cell carcinoma (BCC) excision    History of melanoma excision 11/2020   s/p  MOH's surgery on nose , localized   Hyperlipidemia    Hypertension    followed by pcp   (hx evaluated by cardiology for pre-corial pain and abnormal ekg by dr pietro,  normal CCTA 01-21-2021 no disease)   PMB (postmenopausal bleeding)    Pre-diabetes    RLS (restless legs syndrome)    Wears contact lenses      Social History[1]  Ms.Mcnear reports that she has been smoking cigarettes. She has a 52 pack-year smoking history. She has never used smokeless tobacco. She reports that she does not currently use alcohol. She reports that she does not use drugs.  Tobacco Cessation: Ready to quit: Not Answered Counseling given: Not Answered Tobacco comments: 1 pack a day 11/11/2024 KRD Current every day smoker   Past surgical hx, Family hx, Social hx all reviewed.  Current Outpatient Medications on File Prior to Visit  Medication Sig   ALPRAZolam  (XANAX ) 0.5 MG tablet TAKE 1/2-1 TABLET BY MOUTH 2 TIMES A DAY AS NEEDED FOR ANXIETY   atorvastatin  (LIPITOR) 10 MG tablet Take 1 tablet (10 mg total) by mouth at bedtime.   escitalopram  (LEXAPRO ) 20 MG tablet TAKE 1 TABLET BY MOUTH DAILY   losartan -hydrochlorothiazide  (HYZAAR) 50-12.5 MG tablet TAKE 1 TABLET BY MOUTH DAILY   No current facility-administered medications on file prior to visit.     Allergies[2]  Review Of Systems:  Constitutional:   No   weight loss, night sweats,  Fevers, chills, fatigue, or  lassitude.  HEENT:   No headaches,  Difficulty swallowing,  Tooth/dental problems, or  Sore throat,                No sneezing, itching, ear ache, nasal congestion, post nasal drip,   CV:  No chest pain,  Orthopnea, PND, swelling in lower extremities, anasarca, dizziness, palpitations, syncope.   GI  No heartburn, indigestion, abdominal pain, nausea, vomiting, diarrhea, change in bowel habits, loss of appetite, bloody stools.   Resp: No shortness of breath with exertion or at rest.  No excess mucus, no productive cough,  No non-productive cough,  No coughing up of blood.  No change in color of mucus.  No wheezing.  No chest wall deformity  Skin: no rash or lesions.  GU: no dysuria, change in color of urine, no urgency or frequency.  No flank pain, no hematuria  MS:  No joint pain or swelling.  No decreased range of motion.  No back pain.  Psych:  No change in mood or affect. No depression or anxiety.  No memory loss.   Vital Signs BP 122/71   Pulse 87   Temp 97.6 F (36.4 C) (Oral)   Ht 5' 5 (1.651 m)   Wt 159 lb 12.8 oz (72.5 kg)   SpO2 97%   BMI 26.59 kg/m    Physical Exam GENERAL: No distress, alert and oriented times 3. EARS NOSE THROAT: No sinus tenderness, tympanic membranes clear, pale nasal mucosa, no oral exudate, no post nasal drip, no lymphadenopathy. CHEST: No wheeze, rales, dullness, no accessory muscle use, no nasal flaring, no sternal retractions. CARDIAC: S1, S2, regular rate and rhythm, no murmur. ABDOMINAL: Soft, non tender. ND, BS present,Body mass index is 26.59 kg/m.  EXTREMITIES: No clubbing, cyanosis, edema. No obvious deformities,  NEUROLOGICAL: Normal strength. Alert and oriented x 3, MAE x 4 SKIN: No rashes, warm and dry. No obvious skin lesions PSYCHIATRIC: Normal mood and behavior.    Assessment and Plan Assessment & Plan Suspicious growing right upper lobe pulmonary nodules Slowly  enlarging nodules with solid component, suspicious for low-grade adenocarcinoma.  PET scan negative for hypermetabolic activity. Monitoring warranted due to growth. - Ordered low dose CT scan in three months through lung cancer screening program. - Scheduled follow-up appointment one week after scan to review results. - Plan for biopsy if nodules continue to grow. - Smoking cessation counseling and resources provided to assist with quitting  Atherosclerotic heart disease of native coronary artery without angina pectoris One vessel coronary atherosclerosis in left anterior descending artery. Significant family history. No current angina. Smoking history noted. - Referred to cardiology for evaluation. - Continue Lipitor as prescribed.  Nicotine dependence Long-standing nicotine dependence with unsuccessful cessation attempts. Discussed hypnosis as potential cessation method. - Provided resources on smoking cessation, including hypnosis. - Encouraged use of 1-800-QUIT-NOW for support.   I spent 20 minutes dedicated to the care of this patient on the date of this encounter to include pre-visit review of records, face-to-face time with the patient discussing conditions above, post visit ordering of testing, clinical documentation with the electronic health record, making appropriate referrals as documented, and communicating necessary information to the patient's healthcare team.     Lauraine JULIANNA Lites, NP 11/11/2024  1:30 PM             [1]  Social History Tobacco Use   Smoking status: Every Day    Current packs/day: 1.00    Average packs/day: 1 pack/day for 52.0 years (52.0 ttl pk-yrs)    Types: Cigarettes   Smokeless tobacco: Never   Tobacco comments:    1 pack a day 11/11/2024 KRD  Vaping Use   Vaping status: Never Used  Substance Use Topics   Alcohol use: Not Currently    Comment: occasionally   Drug use: No  [2]  Allergies Allergen Reactions   Citalopram Hydrobromide  Nausea Only   Effexor  [Venlafaxine ] Other (See Comments)    Brain zaps, and feeling mentally sluggish   Lisinopril  Cough   Sertraline  Other (See Comments)    Headaches and brain zaps   "

## 2024-12-10 ENCOUNTER — Ambulatory Visit: Admitting: Cardiology

## 2025-01-26 ENCOUNTER — Encounter

## 2025-02-03 ENCOUNTER — Ambulatory Visit: Admitting: Acute Care
